# Patient Record
Sex: Female | Born: 1985 | Race: White | Hispanic: Yes | Marital: Single | Smoking: Never smoker
Health system: Southern US, Community
[De-identification: ages and names within clinical notes are randomized; demographics above are authoritative.]

## PROBLEM LIST (undated history)

## (undated) DIAGNOSIS — F32A Depression, unspecified: Secondary | ICD-10-CM

## (undated) DIAGNOSIS — D649 Anemia, unspecified: Secondary | ICD-10-CM

## (undated) DIAGNOSIS — S069X9A Unspecified intracranial injury with loss of consciousness of unspecified duration, initial encounter: Secondary | ICD-10-CM

## (undated) DIAGNOSIS — K219 Gastro-esophageal reflux disease without esophagitis: Secondary | ICD-10-CM

## (undated) DIAGNOSIS — J45909 Unspecified asthma, uncomplicated: Secondary | ICD-10-CM

## (undated) DIAGNOSIS — R519 Headache, unspecified: Secondary | ICD-10-CM

## (undated) DIAGNOSIS — M1990 Unspecified osteoarthritis, unspecified site: Secondary | ICD-10-CM

## (undated) DIAGNOSIS — R51 Headache: Secondary | ICD-10-CM

## (undated) DIAGNOSIS — R413 Other amnesia: Secondary | ICD-10-CM

## (undated) DIAGNOSIS — E039 Hypothyroidism, unspecified: Secondary | ICD-10-CM

## (undated) DIAGNOSIS — H547 Unspecified visual loss: Secondary | ICD-10-CM

## (undated) DIAGNOSIS — R569 Unspecified convulsions: Secondary | ICD-10-CM

## (undated) DIAGNOSIS — F329 Major depressive disorder, single episode, unspecified: Secondary | ICD-10-CM

## (undated) DIAGNOSIS — K529 Noninfective gastroenteritis and colitis, unspecified: Secondary | ICD-10-CM

## (undated) DIAGNOSIS — F419 Anxiety disorder, unspecified: Secondary | ICD-10-CM

## (undated) HISTORY — PX: CHOLECYSTECTOMY: SHX55

## (undated) HISTORY — PX: GASTROSTOMY W/ FEEDING TUBE: SUR642

## (undated) HISTORY — PX: BRAIN SURGERY: SHX531

## (undated) HISTORY — DX: Unspecified visual loss: H54.7

## (undated) HISTORY — DX: Noninfective gastroenteritis and colitis, unspecified: K52.9

## (undated) HISTORY — PX: APPENDECTOMY: SHX54

## (undated) HISTORY — DX: Other amnesia: R41.3

## (undated) HISTORY — PX: THYROID SURGERY: SHX805

## (undated) HISTORY — DX: Gastro-esophageal reflux disease without esophagitis: K21.9

---
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
## 2015-05-08 ENCOUNTER — Emergency Department: Payer: Medicaid Other

## 2015-05-08 ENCOUNTER — Encounter: Payer: Self-pay | Admitting: Emergency Medicine

## 2015-05-08 ENCOUNTER — Emergency Department
Admission: EM | Admit: 2015-05-08 | Discharge: 2015-05-08 | Disposition: A | Discharge status: Home / Self Care | Payer: Medicaid Other | Attending: Emergency Medicine | Admitting: Emergency Medicine

## 2015-05-08 DIAGNOSIS — R519 Headache, unspecified: Secondary | ICD-10-CM

## 2015-05-08 DIAGNOSIS — R51 Headache: Secondary | ICD-10-CM | POA: Diagnosis present

## 2015-05-08 MED ORDER — PROCHLORPERAZINE MALEATE 10 MG PO TABS: 10.0000 mg | ORAL_TABLET | Freq: Three times a day (TID) | ORAL | Status: DC | PRN

## 2015-05-08 MED ORDER — PROCHLORPERAZINE EDISYLATE 5 MG/ML IJ SOLN
INTRAMUSCULAR | Status: AC
Start: 2015-05-08 — End: 2015-05-08
  Administered 2015-05-08: 10 mg via INTRAVENOUS
  Filled 2015-05-08: qty 2

## 2015-05-08 MED ORDER — ONDANSETRON HCL 4 MG/2ML IJ SOLN
INTRAMUSCULAR | Status: AC
  Filled 2015-05-08: qty 2

## 2015-05-08 MED ORDER — PROCHLORPERAZINE EDISYLATE 5 MG/ML IJ SOLN
10.0000 mg | Freq: Four times a day (QID) | INTRAMUSCULAR | Status: DC | PRN
  Administered 2015-05-08: 10 mg via INTRAVENOUS

## 2015-05-08 MED ORDER — ONDANSETRON HCL 4 MG/2ML IJ SOLN
4.0000 mg | Freq: Once | INTRAMUSCULAR | Status: AC
  Administered 2015-05-08: 4 mg via INTRAVENOUS

## 2015-05-08 MED ORDER — SODIUM CHLORIDE 0.9 % IV BOLUS (SEPSIS)
1000.0000 mL | Freq: Once | INTRAVENOUS | Status: AC
  Administered 2015-05-08: 1000 mL via INTRAVENOUS
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
## 2015-05-08 NOTE — ED Provider Notes (Signed)
[facility] Emergency Department Provider Note    ____________________________________________  Time seen: 1500  I have reviewed the triage vital signs and the nursing notes.   HISTORY  Chief Complaint Headache   History limited by: Not Limited   HPI Holly Middleton is a 29 y.o. female presents to the emergency department today accompanied by her mother. The patient presents because of headache. It started this morning. It has been severe. Located mostly in the frontal parts of her head. She states she frequently has headaches this bad. 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 Is like her normal headache. She denies any weakness or numbness in any extremity. Denies any fevers.     History reviewed. No pertinent past medical history.  There are no active problems to display for this patient.   History reviewed. No pertinent past surgical history.  Current Outpatient Rx  Name  Route  Sig  Dispense  Refill  . prochlorperazine (COMPAZINE) 10 MG tablet   Oral   Take 1 tablet (10 mg total) by mouth every 8 (eight) hours as needed (Headache).   20 tablet   0     Allergies Review of patient's allergies indicates not on file.  History reviewed. No pertinent family history.  Social History History  Substance Use Topics  . Smoking status: Never Smoker   . Smokeless tobacco: Not on file  . Alcohol Use: No    Review of Systems  Constitutional: Negative for fever. Cardiovascular: Negative for chest pain. Respiratory: Negative for shortness of breath. Gastrointestinal: Some nausea Genitourinary: Negative for dysuria. Musculoskeletal: Negative for back pain. Skin: Negative for rash. Neurological: Significant for headache  10-point ROS otherwise negative.  ____________________________________________   PHYSICAL EXAM:  VITAL SIGNS: ED Triage Vitals  Enc Vitals Group     BP 05/08/15 1140 96/63 mmHg     Pulse Rate 05/08/15 1215 74     Resp 05/08/15 1140 18     Temp 05/08/15 1140 98.5 F (36.9 C)     Temp Source 05/08/15 1140 Oral     SpO2 05/08/15 1140 97 %     Weight 05/08/15 1140 170 lb (77.111 kg)     Height 05/08/15 1140 5\' 2"  (1.575 m)     Head Cir --      Peak Flow --      Pain Score 05/08/15 1136 6   Constitutional: Alert and oriented. Well appearing and in no distress. Eyes: Conjunctivae are normal. PERRL. Normal extraocular movements. ENT   Head: Normocephalic and atraumatic.   Nose: No congestion/rhinnorhea.   Mouth/Throat: Mucous membranes are moist.   Neck: No stridor. Hematological/Lymphatic/Immunilogical: No cervical lymphadenopathy. Cardiovascular: Normal rate, regular rhythm.  No murmurs, rubs, or gallops. Respiratory: Normal respiratory effort without tachypnea nor retractions. Breath sounds are clear and equal bilaterally. No wheezes/rales/rhonchi. Gastrointestinal: Soft and nontender. No distention.  Genitourinary: Deferred Musculoskeletal: Normal range of motion in all extremities. No joint effusions.  No lower extremity tenderness nor edema. Neurologic:  Normal speech and language. No gross focal neurologic deficits are appreciated. Speech is normal.  Skin:  Skin is warm, dry and intact. No rash noted. Psychiatric: Mood and affect are normal. Speech and behavior are normal. Patient exhibits appropriate insight and judgment.  ____________________________________________    LABS (pertinent positives/negatives)  None  ____________________________________________   EKG  None  ____________________________________________    RADIOLOGY   IMPRESSION: Areas of frontal lobe encephalomalacia, significantly more on  the left than on the right. No acute appearing infarct. No hemorrhage, mass, or extra-axial fluid collection. Postoperative change on the right.  ____________________________________________   PROCEDURES  Procedure(s)  performed: None  Critical Care performed: No  ____________________________________________   INITIAL IMPRESSION / ASSESSMENT AND PLAN / ED COURSE  Pertinent labs & imaging results that were available during my care of the patient were reviewed by me and considered in my medical decision making (see chart for details).  Patient here with headache. Patient with history of TBI.  On exam no focal neuro deficits.  Will get CT head in give IV fluids and medications.  ----------------------------------------- 4:25 PM on 05/08/2015 -----------------------------------------  Patient states she feels improved, no longer having a headache after medications. Head CT without any acute findings. Will discharge home with Compazine.  In my judgment, in view of the above findings, the patient has a reassuring evaluation and can be safely discharged.Issues concerning treatment and diagnosis were discussed. There were no barriers to understanding. The plan of treatment explanation was well received by the patient and/or family who then verbalized understanding.    ____________________________________________   FINAL CLINICAL IMPRESSION(S) / ED DIAGNOSES  Final diagnoses:  Headache, unspecified headache type     Phineas SemenGraydon Anjali Manzella, MD 05/08/15 1627

## 2015-05-08 NOTE — ED Notes (Signed)
Patient transported to CT 

## 2015-05-08 NOTE — Discharge Instructions (Signed)
Please seek medical attention for any high fevers, chest pain, shortness of breath, change in behavior, persistent vomiting, bloody stool or any other new or concerning symptoms. ° °Headaches, Frequently Asked Questions °MIGRAINE HEADACHES °Q: What is migraine? What causes it? How can I treat it? °A: Generally, migraine headaches begin as a dull ache. Then they develop into a constant, throbbing, and pulsating pain. You may experience pain at the temples. You may experience pain at the front or back of one or both sides of the head. The pain is usually accompanied by a combination of: °· Nausea. °· Vomiting. °· Sensitivity to light and noise. °Some people (about 15%) experience an aura (see below) before an attack. The cause of migraine is believed to be chemical reactions in the brain. Treatment for migraine may include over-the-counter or prescription medications. It may also include self-help techniques. These include relaxation training and biofeedback.  °Q: What is an aura? °A: About 15% of people with migraine get an "aura". This is a sign of neurological symptoms that occur before a migraine headache. You may see wavy or jagged lines, dots, or flashing lights. You might experience tunnel vision or blind spots in one or both eyes. The aura can include visual or auditory hallucinations (something imagined). It may include disruptions in smell (such as strange odors), taste or touch. Other symptoms include: °· Numbness. °· A "pins and needles" sensation. °· Difficulty in recalling or speaking the correct word. °These neurological events may last as long as 60 minutes. These symptoms will fade as the headache begins. °Q: What is a trigger? °A: Certain physical or environmental factors can lead to or "trigger" a migraine. These include: °· Foods. °· Hormonal changes. °· Weather. °· Stress. °It is important to remember that triggers are different for everyone. To help prevent migraine attacks, you need to figure  out which triggers affect you. Keep a headache diary. This is a good way to track triggers. The diary will help you talk to your healthcare professional about your condition. °Q: Does weather affect migraines? °A: Bright sunshine, hot, humid conditions, and drastic changes in barometric pressure may lead to, or "trigger," a migraine attack in some people. But studies have shown that weather does not act as a trigger for everyone with migraines. °Q: What is the link between migraine and hormones? °A: Hormones start and regulate many of your body's functions. Hormones keep your body in balance within a constantly changing environment. The levels of hormones in your body are unbalanced at times. Examples are during menstruation, pregnancy, or menopause. That can lead to a migraine attack. In fact, about three quarters of all women with migraine report that their attacks are related to the menstrual cycle.  °Q: Is there an increased risk of stroke for migraine sufferers? °A: The likelihood of a migraine attack causing a stroke is very remote. That is not to say that migraine sufferers cannot have a stroke associated with their migraines. In persons under age 40, the most common associated factor for stroke is migraine headache. But over the course of a person's normal life span, the occurrence of migraine headache may actually be associated with a reduced risk of dying from cerebrovascular disease due to stroke.  °Q: What are acute medications for migraine? °A: Acute medications are used to treat the pain of the headache after it has started. Examples over-the-counter medications, NSAIDs, ergots, and triptans.  °Q: What are the triptans? °A: Triptans are the newest class of abortive medications.   They are specifically targeted to treat migraine. Triptans are vasoconstrictors. They moderate some chemical reactions in the brain. The triptans work on receptors in your brain. Triptans help to restore the balance of a  neurotransmitter called serotonin. Fluctuations in levels of serotonin are thought to be a main cause of migraine.  °Q: Are over-the-counter medications for migraine effective? °A: Over-the-counter, or "OTC," medications may be effective in relieving mild to moderate pain and associated symptoms of migraine. But you should see your caregiver before beginning any treatment regimen for migraine.  °Q: What are preventive medications for migraine? °A: Preventive medications for migraine are sometimes referred to as "prophylactic" treatments. They are used to reduce the frequency, severity, and length of migraine attacks. Examples of preventive medications include antiepileptic medications, antidepressants, beta-blockers, calcium channel blockers, and NSAIDs (nonsteroidal anti-inflammatory drugs). °Q: Why are anticonvulsants used to treat migraine? °A: During the past few years, there has been an increased interest in antiepileptic drugs for the prevention of migraine. They are sometimes referred to as "anticonvulsants". Both epilepsy and migraine may be caused by similar reactions in the brain.  °Q: Why are antidepressants used to treat migraine? °A: Antidepressants are typically used to treat people with depression. They may reduce migraine frequency by regulating chemical levels, such as serotonin, in the brain.  °Q: What alternative therapies are used to treat migraine? °A: The term "alternative therapies" is often used to describe treatments considered outside the scope of conventional Western medicine. Examples of alternative therapy include acupuncture, acupressure, and yoga. Another common alternative treatment is herbal therapy. Some herbs are believed to relieve headache pain. Always discuss alternative therapies with your caregiver before proceeding. Some herbal products contain arsenic and other toxins. °TENSION HEADACHES °Q: What is a tension-type headache? What causes it? How can I treat it? °A:  Tension-type headaches occur randomly. They are often the result of temporary stress, anxiety, fatigue, or anger. Symptoms include soreness in your temples, a tightening band-like sensation around your head (a "vice-like" ache). Symptoms can also include a pulling feeling, pressure sensations, and contracting head and neck muscles. The headache begins in your forehead, temples, or the back of your head and neck. Treatment for tension-type headache may include over-the-counter or prescription medications. Treatment may also include self-help techniques such as relaxation training and biofeedback. °CLUSTER HEADACHES °Q: What is a cluster headache? What causes it? How can I treat it? °A: Cluster headache gets its name because the attacks come in groups. The pain arrives with little, if any, warning. It is usually on one side of the head. A tearing or bloodshot eye and a runny nose on the same side of the headache may also accompany the pain. Cluster headaches are believed to be caused by chemical reactions in the brain. They have been described as the most severe and intense of any headache type. Treatment for cluster headache includes prescription medication and oxygen. °SINUS HEADACHES °Q: What is a sinus headache? What causes it? How can I treat it? °A: When a cavity in the bones of the face and skull (a sinus) becomes inflamed, the inflammation will cause localized pain. This condition is usually the result of an allergic reaction, a tumor, or an infection. If your headache is caused by a sinus blockage, such as an infection, you will probably have a fever. An x-ray will confirm a sinus blockage. Your caregiver's treatment might include antibiotics for the infection, as well as antihistamines or decongestants.  °REBOUND HEADACHES °Q: What is a rebound   headache? What causes it? How can I treat it? °A: A pattern of taking acute headache medications too often can lead to a condition known as "rebound headache." A  pattern of taking too much headache medication includes taking it more than 2 days per week or in excessive amounts. That means more than the label or a caregiver advises. With rebound headaches, your medications not only stop relieving pain, they actually begin to cause headaches. Doctors treat rebound headache by tapering the medication that is being overused. Sometimes your caregiver will gradually substitute a different type of treatment or medication. Stopping may be a challenge. Regularly overusing a medication increases the potential for serious side effects. Consult a caregiver if you regularly use headache medications more than 2 days per week or more than the label advises. °ADDITIONAL QUESTIONS AND ANSWERS °Q: What is biofeedback? °A: Biofeedback is a self-help treatment. Biofeedback uses special equipment to monitor your body's involuntary physical responses. Biofeedback monitors: °· Breathing. °· Pulse. °· Heart rate. °· Temperature. °· Muscle tension. °· Brain activity. °Biofeedback helps you refine and perfect your relaxation exercises. You learn to control the physical responses that are related to stress. Once the technique has been mastered, you do not need the equipment any more. °Q: Are headaches hereditary? °A: Four out of five (80%) of people that suffer report a family history of migraine. Scientists are not sure if this is genetic or a family predisposition. Despite the uncertainty, a child has a 50% chance of having migraine if one parent suffers. The child has a 75% chance if both parents suffer.  °Q: Can children get headaches? °A: By the time they reach high school, most young people have experienced some type of headache. Many safe and effective approaches or medications can prevent a headache from occurring or stop it after it has begun.  °Q: What type of doctor should I see to diagnose and treat my headache? °A: Start with your primary caregiver. Discuss his or her experience and  approach to headaches. Discuss methods of classification, diagnosis, and treatment. Your caregiver may decide to recommend you to a headache specialist, depending upon your symptoms or other physical conditions. Having diabetes, allergies, etc., may require a more comprehensive and inclusive approach to your headache. The National Headache Foundation will provide, upon request, a list of NHF physician members in your state. °Document Released: 03/06/2004 Document Revised: 03/08/2012 Document Reviewed: 08/14/2008 °ExitCare® Patient Information ©2015 [employer]. This information is not intended to replace advice given to you by your health care provider. Make sure you discuss any questions you have with your health care provider. ° °

## 2016-03-02 ENCOUNTER — Emergency Department: Payer: Medicaid Other

## 2016-03-02 ENCOUNTER — Emergency Department
Admission: EM | Admit: 2016-03-02 | Discharge: 2016-03-02 | Disposition: A | Discharge status: Home / Self Care | Payer: Medicaid Other | Attending: Emergency Medicine | Admitting: Emergency Medicine

## 2016-03-02 ENCOUNTER — Encounter: Payer: Self-pay | Admitting: Emergency Medicine

## 2016-03-02 DIAGNOSIS — F1210 Cannabis abuse, uncomplicated: Secondary | ICD-10-CM | POA: Insufficient documentation

## 2016-03-02 DIAGNOSIS — F1310 Sedative, hypnotic or anxiolytic abuse, uncomplicated: Secondary | ICD-10-CM | POA: Diagnosis not present

## 2016-03-02 DIAGNOSIS — Z87820 Personal history of traumatic brain injury: Secondary | ICD-10-CM | POA: Diagnosis not present

## 2016-03-02 DIAGNOSIS — R110 Nausea: Secondary | ICD-10-CM | POA: Insufficient documentation

## 2016-03-02 DIAGNOSIS — F419 Anxiety disorder, unspecified: Secondary | ICD-10-CM | POA: Insufficient documentation

## 2016-03-02 DIAGNOSIS — F17200 Nicotine dependence, unspecified, uncomplicated: Secondary | ICD-10-CM | POA: Insufficient documentation

## 2016-03-02 DIAGNOSIS — R4020 Unspecified coma: Secondary | ICD-10-CM | POA: Insufficient documentation

## 2016-03-02 DIAGNOSIS — R569 Unspecified convulsions: Secondary | ICD-10-CM

## 2016-03-02 DIAGNOSIS — M542 Cervicalgia: Secondary | ICD-10-CM | POA: Diagnosis not present

## 2016-03-02 HISTORY — DX: Unspecified convulsions: R56.9

## 2016-03-02 HISTORY — DX: Unspecified intracranial injury with loss of consciousness of unspecified duration, initial encounter: S06.9X9A

## 2016-03-02 LAB — URINALYSIS COMPLETE WITH MICROSCOPIC (ARMC ONLY)
Bilirubin Urine: NEGATIVE
GLUCOSE, UA: NEGATIVE mg/dL
HGB URINE DIPSTICK: NEGATIVE
KETONES UR: NEGATIVE mg/dL
Nitrite: NEGATIVE
PH: 5 (ref 5.0–8.0)
Protein, ur: NEGATIVE mg/dL
Specific Gravity, Urine: 1.016 (ref 1.005–1.030)

## 2016-03-02 LAB — COMPREHENSIVE METABOLIC PANEL
ALBUMIN: 4.4 g/dL (ref 3.5–5.0)
ALK PHOS: 77 U/L (ref 38–126)
ALT: 23 U/L (ref 14–54)
ANION GAP: 15 (ref 5–15)
AST: 39 U/L (ref 15–41)
BILIRUBIN TOTAL: 0.6 mg/dL (ref 0.3–1.2)
BUN: 11 mg/dL (ref 6–20)
CO2: 19 mmol/L — ABNORMAL LOW (ref 22–32)
Calcium: 8.7 mg/dL — ABNORMAL LOW (ref 8.9–10.3)
Chloride: 100 mmol/L — ABNORMAL LOW (ref 101–111)
Creatinine, Ser: 1.02 mg/dL — ABNORMAL HIGH (ref 0.44–1.00)
GFR calc Af Amer: >60 mL/min (ref >60)
GFR calc non Af Amer: >60 mL/min (ref >60)
Glucose, Bld: 141 mg/dL — ABNORMAL HIGH (ref 65–99)
POTASSIUM: 3.1 mmol/L — AB (ref 3.5–5.1)
SODIUM: 134 mmol/L — AB (ref 135–145)
Total Protein: 7.8 g/dL (ref 6.5–8.1)

## 2016-03-02 LAB — URINE DRUG SCREEN, QUALITATIVE (ARMC ONLY)
AMPHETAMINES, UR SCREEN: NOT DETECTED
BARBITURATES, UR SCREEN: NOT DETECTED
BENZODIAZEPINE, UR SCRN: NOT DETECTED
Cannabinoid 50 Ng, Ur ~~LOC~~: POSITIVE — AB
Cocaine Metabolite,Ur ~~LOC~~: NOT DETECTED
MDMA (Ecstasy)Ur Screen: NOT DETECTED
METHADONE SCREEN, URINE: NOT DETECTED
OPIATE, UR SCREEN: NOT DETECTED
PHENCYCLIDINE (PCP) UR S: NOT DETECTED
Tricyclic, Ur Screen: POSITIVE — AB

## 2016-03-02 LAB — CBC
HCT: 35.7 % (ref 35.0–47.0)
HEMOGLOBIN: 12.4 g/dL (ref 12.0–16.0)
MCH: 33.5 pg (ref 26.0–34.0)
MCHC: 34.9 g/dL (ref 32.0–36.0)
MCV: 96.2 fL (ref 80.0–100.0)
Platelets: 180 10*3/uL (ref 150–440)
RBC: 3.71 MIL/uL — ABNORMAL LOW (ref 3.80–5.20)
RDW: 13.5 % (ref 11.5–14.5)
WBC: 5.2 10*3/uL (ref 3.6–11.0)

## 2016-03-02 MED ORDER — LEVETIRACETAM 750 MG PO TABS: 750.0000 mg | ORAL_TABLET | Freq: Two times a day (BID) | ORAL | Status: AC

## 2016-03-02 MED ORDER — SODIUM CHLORIDE 0.9 % IV SOLN
750.0000 mg | Freq: Once | INTRAVENOUS | Status: AC
  Administered 2016-03-02: 750 mg via INTRAVENOUS
  Filled 2016-03-02: qty 7.5

## 2016-03-02 NOTE — ED Notes (Signed)
[facility], family reported to EMS that patient was holding an infant and passed out dropping the baby, patient then began to posturing and shaking for approx. 30 seconds. On arrival to ED patient alert and anxious. Patient only complaint at this time is nausea.

## 2016-03-02 NOTE — ED Provider Notes (Signed)
[facility] Emergency Department Provider Note  ____________________________________________  Time seen: On arrival, via EMS  I have reviewed the triage vital signs and the nursing notes.   HISTORY  Chief Complaint Seizures    HPI Holly Middleton is a 30 y.o. female who presents with possible seizure. Reportedly the patient became unresponsive with twitching at the mouth and rigidity of both arms. Reportedly this lasted approximately 30 seconds. Patient feels well now although she is anxious. She denies recent head injury. She does have a history of traumatic brain injury in the past which was apparently affected her short-term memory per her mother. Patient denies neuro deficits. She denies fevers or chills. No recent illnesses. No change in vision    Past Medical History  Diagnosis Date  . TBI (traumatic brain injury) (HCC)     There are no active problems to display for this patient.   Past Surgical History  Procedure Laterality Date  . Brain surgery      Current Outpatient Rx  Name  Route  Sig  Dispense  Refill  . prochlorperazine (COMPAZINE) 10 MG tablet   Oral   Take 1 tablet (10 mg total) by mouth every 8 (eight) hours as needed (Headache).   20 tablet   0     Allergies Review of patient's allergies indicates no known allergies.  No family history on file.  Social History Social History  Substance Use Topics  . Smoking status: Current Some Day Smoker  . Smokeless tobacco: None  . Alcohol Use: Yes    Review of Systems  Constitutional: No fever. No recent illness  Eyes: Negative for visual changes. ENT: No neck pain Cardiovascular: Negative for chest pain. Respiratory: Negative for shortness of breath. Gastrointestinal: Positive for nausea. Genitourinary: Negative for dysuria. Musculoskeletal: Negative for back pain. For neck pain Skin: Negative for rash. Neurological: Negative for headaches or focal weakness Psychiatric:  Anxious    ____________________________________________   PHYSICAL EXAM:  VITAL SIGNS: ED Triage Vitals  Enc Vitals Group     BP 03/02/16 1509 104/90 mmHg     Pulse Rate 03/02/16 1509 83     Resp 03/02/16 1509 18     Temp --      Temp src --      SpO2 03/02/16 1509 96 %     Weight 03/02/16 1509 168 lb (76.204 kg)     Height 03/02/16 1509 5\' 2"  (1.575 m)     Head Cir --      Peak Flow --      Pain Score 03/02/16 1510 0     Pain Loc --      Pain Edu? --      Excl. in GC? --      Constitutional: Alert and oriented. Well appearing and in no distress. Tearful Eyes: Conjunctivae are normal. PERRLA, EOMI ENT   Head: Normocephalic and atraumatic.   Mouth/Throat: Mucous membranes are moist. Cardiovascular: Normal rate, regular rhythm. Normal and symmetric distal pulses are present in all extremities. Respiratory: Normal respiratory effort without tachypnea nor retractions. Breath sounds are clear and equal bilaterally.  Gastrointestinal: Soft and non-tender in all quadrants. No distention.  Genitourinary: deferred Musculoskeletal: Nontender with normal range of motion in all extremities.  Neurologic:  Normal speech and language. No gross focal neurologic deficits are appreciated. Skin:  Skin is warm, dry and intact. No rash noted. Psychiatric: Mood and affect are normal. Patient exhibits appropriate insight and judgment.  ____________________________________________  LABS (pertinent positives/negatives)  Labs Reviewed  CBC  COMPREHENSIVE METABOLIC PANEL  URINALYSIS COMPLETEWITH MICROSCOPIC ([facility] ONLY)  URINE DRUG SCREEN, QUALITATIVE ([facility] ONLY)    ____________________________________________   EKG  ED ECG REPORT I, Jene Every, the attending physician, personally viewed and interpreted this ECG.  Date: 03/02/2016 EKG Time: 3:05 PM Rate: 79 Rhythm: normal sinus rhythm QRS Axis: normal Intervals: Prolonged QT ST/T Wave abnormalities:  normal Conduction Disturbances: none Narrative Interpretation: Significant prolonged QT   ____________________________________________    RADIOLOGY I have personally reviewed any xrays that were ordered on this patient: CT head shows encephalomalacia  ____________________________________________   PROCEDURES  Procedure(s) performed: none  Critical Care performed: none  ____________________________________________   INITIAL IMPRESSION / ASSESSMENT AND PLAN / ED COURSE  Pertinent labs & imaging results that were available during my care of the patient were reviewed by me and considered in my medical decision making (see chart for details).  Patient presents with possible seizure. Reviewing her records there is an old CT that shows frontal encephalomalacia so she is at higher risk for seizure.  CT head shows encephalomalacia, discuss this with Dr. Loretha Brasil of neurology who studied the CT. He recommends Keppra 750 twice a day.  I did give the patient 750 mg IV in the emergency department while we observed her to make sure no further seizure activity. Recommended outpatient follow-up with neurology. Patient and mother agree with this plan    ____________________________________________   FINAL CLINICAL IMPRESSION(S) / ED DIAGNOSES  Final diagnoses:  Seizure (HCC)     Jene Every, MD 03/02/16 2116

## 2016-03-02 NOTE — Discharge Instructions (Signed)

## 2016-03-02 NOTE — ED Notes (Signed)
Patient mother at bedside, reports that over the past week patient has an increase in short term memory loss. Today while sitting on the couch holding an infant patient began to shake and fall over to the side with hands and mouth twitching, patient mother also reports foaming at the mouth and that patients lips turned purple, states that episode lasted for about 15-30 seconds.

## 2016-03-28 ENCOUNTER — Encounter: Payer: Self-pay | Admitting: Emergency Medicine

## 2016-03-28 ENCOUNTER — Emergency Department
Admission: EM | Admit: 2016-03-28 | Discharge: 2016-03-28 | Disposition: A | Discharge status: Home / Self Care | Payer: Medicaid Other | Attending: Emergency Medicine | Admitting: Emergency Medicine

## 2016-03-28 DIAGNOSIS — T426X5A Adverse effect of other antiepileptic and sedative-hypnotic drugs, initial encounter: Secondary | ICD-10-CM | POA: Diagnosis not present

## 2016-03-28 DIAGNOSIS — F17200 Nicotine dependence, unspecified, uncomplicated: Secondary | ICD-10-CM | POA: Insufficient documentation

## 2016-03-28 DIAGNOSIS — R21 Rash and other nonspecific skin eruption: Secondary | ICD-10-CM | POA: Diagnosis present

## 2016-03-28 DIAGNOSIS — L270 Generalized skin eruption due to drugs and medicaments taken internally: Secondary | ICD-10-CM | POA: Insufficient documentation

## 2016-03-28 DIAGNOSIS — Z79899 Other long term (current) drug therapy: Secondary | ICD-10-CM | POA: Diagnosis not present

## 2016-03-28 DIAGNOSIS — T50905A Adverse effect of unspecified drugs, medicaments and biological substances, initial encounter: Secondary | ICD-10-CM

## 2016-03-28 LAB — CBC WITH DIFFERENTIAL/PLATELET
BASOS ABS: 0 10*3/uL (ref 0–0.1)
BASOS PCT: 1 %
EOS PCT: 2 %
Eosinophils Absolute: 0.1 10*3/uL (ref 0–0.7)
HCT: 37.4 % (ref 35.0–47.0)
Hemoglobin: 13.1 g/dL (ref 12.0–16.0)
Lymphocytes Relative: 22 %
Lymphs Abs: 0.8 10*3/uL — ABNORMAL LOW (ref 1.0–3.6)
MCH: 33.1 pg (ref 26.0–34.0)
MCHC: 35 g/dL (ref 32.0–36.0)
MCV: 94.7 fL (ref 80.0–100.0)
MONO ABS: 0.2 10*3/uL (ref 0.2–0.9)
Monocytes Relative: 6 %
Neutro Abs: 2.5 10*3/uL (ref 1.4–6.5)
Neutrophils Relative %: 69 %
Platelets: 143 10*3/uL — ABNORMAL LOW (ref 150–440)
RBC: 3.95 MIL/uL (ref 3.80–5.20)
RDW: 13.3 % (ref 11.5–14.5)
WBC: 3.7 10*3/uL (ref 3.6–11.0)

## 2016-03-28 LAB — BASIC METABOLIC PANEL
Anion gap: 6 (ref 5–15)
BUN: 14 mg/dL (ref 6–20)
CALCIUM: 8.9 mg/dL (ref 8.9–10.3)
CO2: 29 mmol/L (ref 22–32)
CREATININE: 0.7 mg/dL (ref 0.44–1.00)
Chloride: 98 mmol/L — ABNORMAL LOW (ref 101–111)
GFR calc Af Amer: >60 mL/min (ref >60)
GFR calc non Af Amer: >60 mL/min (ref >60)
GLUCOSE: 106 mg/dL — AB (ref 65–99)
Potassium: 3.3 mmol/L — ABNORMAL LOW (ref 3.5–5.1)
Sodium: 133 mmol/L — ABNORMAL LOW (ref 135–145)

## 2016-03-28 MED ORDER — DIPHENHYDRAMINE HCL 25 MG PO CAPS
25.0000 mg | ORAL_CAPSULE | Freq: Once | ORAL | Status: AC
  Administered 2016-03-28: 25 mg via ORAL
  Filled 2016-03-28: qty 1

## 2016-03-28 MED ORDER — FAMOTIDINE 20 MG PO TABS
40.0000 mg | ORAL_TABLET | Freq: Once | ORAL | Status: AC
  Administered 2016-03-28: 40 mg via ORAL
  Filled 2016-03-28: qty 2

## 2016-03-28 NOTE — Discharge Instructions (Signed)
Drug Allergy °Allergic reactions to medicines are common. Some allergic reactions are mild. A delayed type of drug allergy that occurs 1 week or more after exposure to a medicine or vaccine is called serum sickness. A life-threatening, sudden (acute) allergic reaction that involves the whole body is called anaphylaxis. °CAUSES  °"True" drug allergies occur when there is an allergic reaction to a medicine. This is caused by overactivity of the immune system. First, the body becomes sensitized. The immune system is triggered by your first exposure to the medicine. Following this first exposure, future exposure to the same medicine may be life-threatening. °Almost any medicine can cause an allergic reaction. Common ones are: °· Penicillin. °· Sulfonamides (sulfa drugs). °· Local anesthetics. °· X-ray dyes that contain iodine. °SYMPTOMS  °Common symptoms of a minor allergic reaction are: °· Swelling around the mouth. °· An itchy red rash or hives. °· Vomiting or diarrhea. °Anaphylaxis can cause swelling of the mouth and throat. This makes it difficult to breathe and swallow. Severe reactions can be fatal within seconds, even after exposure to only a trace amount of the drug that causes the reaction. °HOME CARE INSTRUCTIONS °· If you are unsure of what caused your reaction, write down: °¨ The names of the medicines you took. °¨ How much medicine you took. °¨ How you took the medicine, such as whether you took a pill, injected the medicine, or applied it to your skin. °¨ All of the things you ate and drank. °¨ The date and time of your reaction. °¨ The symptoms of the reaction. °· You may want to follow up with an allergy specialist after the reaction has cleared in order to be tested to confirm the allergy. It is important to confirm that your reaction is an allergy, not just a side effect to the medicine. If you have a true allergy to a medicine, this may prevent that medicine and related medicines from being given to  you when you are very ill. °· If you have hives or a rash: °¨ Take medicines as directed by your caregiver. °¨ You may use an over-the-counter antihistamine (diphenhydramine) as needed. °¨ Apply cold compresses to the skin or take baths in cool water. Avoid hot baths or showers. °· If you are severely allergic: °¨ Continuous observation after a severe reaction may be needed. Hospitalization is often required. °¨ Wear a medical alert bracelet or necklace stating your allergy. °¨ You and your family must learn how to use an anaphylaxis kit or give an epinephrine injection to temporarily treat an emergency allergic reaction. If you have had a severe reaction, always carry your epinephrine injection or anaphylaxis kit with you. This can be lifesaving if you have a severe reaction. °· Do not drive or perform tasks after treatment until the medicines used to treat your reaction have worn off, or until your caregiver says it is okay. °· If you have a drug allergy that was confirmed by your health care provider: °¨ Carry information about the drug allergy with you at all times. °¨ Always check with a pharmacist before taking any over-the-counter medicine. °SEEK MEDICAL CARE IF:  °· You think you had an allergic reaction. Symptoms usually start within 30 minutes after exposure. °· Symptoms are getting worse rather than better. °· You develop new symptoms. °· The symptoms that brought you to your caregiver return. °SEEK IMMEDIATE MEDICAL CARE IF:  °· You have swelling of the mouth, difficulty breathing, or wheezing. °· You have a tight   feeling in your chest or throat.  You develop hives, swelling, or itching all over your body.  You develop severe vomiting or diarrhea.  You feel faint or pass out. This is an emergency. Use your epinephrine injection or anaphylaxis kit as you have been instructed. Call for emergency medical help. Even if you improve after the injection, you need to be examined at a hospital emergency  department. MAKE SURE YOU:   Understand these instructions.  Will watch your condition.  Will get help right away if you are not doing well or get worse.   This information is not intended to replace advice given to you by your health care provider. Make sure you discuss any questions you have with your health care provider.   Document Released: 12/15/2005 Document Revised: 01/05/2015 Document Reviewed: 07/17/2015 Elsevier Interactive Patient Education 2016 [place] appear to be experiencing a common drug reaction to your seizure medicine, Lamictal (lamotrigine). You should discontinue the medicine and follow-up with your provider as discussed. Continue to dose the Keppra as prescribed. Consider dosing OTC Benadryl and Zantac for histamine blockade and itch relief.

## 2016-03-28 NOTE — ED Notes (Signed)
C/o fever and bodyaches and dizziness.  States these sx started on 4/18 after switching to a new seizure medication. NAD

## 2016-03-28 NOTE — ED Provider Notes (Signed)
[facility] Emergency Department Provider Note ____________________________________________  Time seen: 1204  I have reviewed the triage vital signs and the nursing notes.  HISTORY  Chief Complaint  Fever  HPI Holly Middleton is a 30 y.o. female presents to the ED for evaluation of symptoms that include increased headache, dizziness, rash, and facial swelling. She describes she is also had some nausea and vomiting. The symptoms seem to have begun about 2 days following starting her Lamictal at 25 mg twice a day on 3/18. She was evaluated here on 3/41for witnessed seizure activities. She was evaluated via CT and labs and started on Keppra at discharge. She's been tolerating the Keppra well with only mild, tolerable headaches until she was followed up by her primary neurologist, Dr. Malvin Johns. Since that time she's noted an itchy rash globally from the head to the trunk and extremities. She has also noted some increased dizziness with changing positions. She is also noticed some intermittent fevers with a Tmax  of 101.48F 4 days prior. When she contacted her neurologist via telephone, she was advised to discontinue the Lamictal and report to the ED for further evaluation. She last dosed her Lamictal yesterday morning. She denies any interim seizure activity.   Past Medical History  Diagnosis Date  . TBI (traumatic brain injury) (HCC)     There are no active problems to display for this patient.   Past Surgical History  Procedure Laterality Date  . Brain surgery      Current Outpatient Rx  Name  Route  Sig  Dispense  Refill  . levETIRAcetam (KEPPRA) 750 MG tablet   Oral   Take 1 tablet (750 mg total) by mouth 2 (two) times daily.   60 tablet   0   . prochlorperazine (COMPAZINE) 10 MG tablet   Oral   Take 1 tablet (10 mg total) by mouth every 8 (eight) hours as needed (Headache).   20 tablet   0    Allergies Review of patient's allergies indicates no known  allergies.  No family history on file.  Social History Social History  Substance Use Topics  . Smoking status: Current Some Day Smoker  . Smokeless tobacco: None  . Alcohol Use: Yes   Review of Systems  Constitutional: Negative for fever. Eyes: Positive for visual changes. ENT: Negative for sore throat. Cardiovascular: Negative for chest pain. Respiratory: Negative for shortness of breath. Gastrointestinal: Negative for abdominal pain and diarrhea. Positive for nausea & vomiting Genitourinary: Negative for dysuria. Musculoskeletal: Negative for back pain. Skin: Positive for rash. Neurological: Negative for headaches, focal weakness or numbness. ____________________________________________  PHYSICAL EXAM:  VITAL SIGNS: ED Triage Vitals  Enc Vitals Group     BP 03/28/16 1123 101/68 mmHg     Pulse Rate 03/28/16 1123 95     Resp 03/28/16 1123 18     Temp 03/28/16 1123 98.6 F (37 C)     Temp Source 03/28/16 1123 Oral     SpO2 03/28/16 1123 97 %     Weight 03/28/16 1123 150 lb (68.04 kg)     Height 03/28/16 1123  (1.651 m)     Head Cir --      Peak Flow --      Pain Score --      Pain Loc --      Pain Edu? --      Excl. in GC? --    Constitutional: Alert and oriented. Well appearing and in no distress.  Head: Normocephalic and atraumatic.      Eyes: Conjunctivae are normal. PERRL. Normal extraocular movements      Ears: Canals clear. TMs intact bilaterally.   Nose: No congestion/rhinorrhea.   Mouth/Throat: Mucous membranes are moist.   Neck: Supple. No thyromegaly. Hematological/Lymphatic/Immunological: No cervical lymphadenopathy. Cardiovascular: Normal rate, regular rhythm.  Respiratory: Normal respiratory effort. No wheezes/rales/rhonchi. Gastrointestinal: Soft and nontender. No distention. Musculoskeletal: Nontender with normal range of motion in all extremities.  Neurologic: Cranial nerves II through XII grossly intact. Normal UE and LE DTRs  bilaterally. Normal gait without ataxia. Normal speech and language. No gross focal neurologic deficits are appreciated. Skin:  Skin is warm, dry and intact. Scattered, erythematous maculopapular rash to the scalp, neck, torso and extremities. Psychiatric: Mood and affect are normal. Patient exhibits appropriate insight and judgment. ____________________________________________   LABS (pertinent positives/negatives) Labs Reviewed  BASIC METABOLIC PANEL - Abnormal; Notable for the following:    Sodium 133 (*)    Potassium 3.3 (*)    Chloride 98 (*)    Glucose, Bld 106 (*)    All other components within normal limits  CBC WITH DIFFERENTIAL/PLATELET - Abnormal; Notable for the following:    Platelets 143 (*)    Lymphs Abs 0.8 (*)    All other components within normal limits  ____________________________________________  PROCEDURES  Pepcid 40 mg PO Benadryl 25 mg PO ____________________________________________  INITIAL IMPRESSION / ASSESSMENT AND PLAN / ED COURSE  Patient was appears to be in acute drug reaction to Lamictal including rash, dizziness, and increased headache. Her exam otherwise is reassuring without any interim seizure activity or neuromuscular deficit. She is advised to discontinue Lamictal immediately and continue with the Keppra as previously prescribed. She will follow-up with Dr. Malvin JohnsPotter next week for further evaluation. Patient is reassured by her normal lab values today. ____________________________________________  FINAL CLINICAL IMPRESSION(S) / ED DIAGNOSES  Final diagnoses:  Adverse drug reaction, initial encounter      Lissa HoardJenise V Bacon Chelsea Nusz, PA-C 03/28/16 1858  Emily FilbertJonathan E Williams, MD 04/01/16 [phone]

## 2016-03-28 NOTE — ED Notes (Addendum)
Patient states on 3/5 she was seen for seizure which she has never had before.  She was started on Keppra and followed up with Dr. Malvin JohnsPotter.  He changed her Keppra to Lamictal 25mg  BID on 3/18. Patient states on 3/20 she started having memory issues, facial swelling, red rash on arms, trunk and back.  She has vomited several times. Vomited 4x today.  Patient did leave message with office who told her to go to the ED.  She also reports dizziness and fever.  Her last fever was approximately 4 days ago and was 101.9.  Patient states she stopped taking the Lamictal yesterday and was due to increase her dosage tomorrow.  Pt is NAD at this time.  Patient tried to bend over to pick up an item she dropped on the floor and reported dizziness.

## 2016-04-24 LAB — HM PAP SMEAR: HM PAP: NEGATIVE

## 2016-05-30 ENCOUNTER — Encounter
Admission: RE | Admit: 2016-05-30 | Discharge: 2016-05-30 | Disposition: A | Discharge status: Home / Self Care | Payer: Medicaid Other | Source: Ambulatory Visit | Attending: Obstetrics and Gynecology | Admitting: Obstetrics and Gynecology

## 2016-05-30 DIAGNOSIS — Z01810 Encounter for preprocedural cardiovascular examination: Secondary | ICD-10-CM | POA: Insufficient documentation

## 2016-05-30 DIAGNOSIS — Z01812 Encounter for preprocedural laboratory examination: Secondary | ICD-10-CM | POA: Insufficient documentation

## 2016-05-30 HISTORY — DX: Unspecified osteoarthritis, unspecified site: M19.90

## 2016-05-30 HISTORY — DX: Major depressive disorder, single episode, unspecified: F32.9

## 2016-05-30 HISTORY — DX: Depression, unspecified: F32.A

## 2016-05-30 HISTORY — DX: Unspecified asthma, uncomplicated: J45.909

## 2016-05-30 HISTORY — DX: Anxiety disorder, unspecified: F41.9

## 2016-05-30 HISTORY — DX: Anemia, unspecified: D64.9

## 2016-05-30 HISTORY — DX: Headache: R51

## 2016-05-30 HISTORY — DX: Unspecified convulsions: R56.9

## 2016-05-30 HISTORY — DX: Hypothyroidism, unspecified: E03.9

## 2016-05-30 HISTORY — DX: Headache, unspecified: R51.9

## 2016-05-30 HISTORY — DX: Gastro-esophageal reflux disease without esophagitis: K21.9

## 2016-05-30 LAB — CBC
HCT: 40.7 % (ref 35.0–47.0)
Hemoglobin: 14 g/dL (ref 12.0–16.0)
MCH: 31.5 pg (ref 26.0–34.0)
MCHC: 34.5 g/dL (ref 32.0–36.0)
MCV: 91.3 fL (ref 80.0–100.0)
PLATELETS: 242 10*3/uL (ref 150–440)
RBC: 4.46 MIL/uL (ref 3.80–5.20)
RDW: 12.4 % (ref 11.5–14.5)
WBC: 5.6 10*3/uL (ref 3.6–11.0)

## 2016-05-30 LAB — BASIC METABOLIC PANEL
ANION GAP: 10 (ref 5–15)
BUN: 9 mg/dL (ref 6–20)
CO2: 27 mmol/L (ref 22–32)
Calcium: 9.6 mg/dL (ref 8.9–10.3)
Chloride: 102 mmol/L (ref 101–111)
Creatinine, Ser: 0.71 mg/dL (ref 0.44–1.00)
GFR calc Af Amer: >60 mL/min (ref >60)
GFR calc non Af Amer: >60 mL/min (ref >60)
Glucose, Bld: 101 mg/dL — ABNORMAL HIGH (ref 65–99)
POTASSIUM: 3 mmol/L — AB (ref 3.5–5.1)
SODIUM: 139 mmol/L (ref 135–145)

## 2016-05-30 NOTE — Pre-Procedure Instructions (Signed)
Faxed the neurology clearance fax to Dr's Malvin JohnsPotter and Bonney AidStaebler.Received Status OK for Dr. Bonney AidStaebler, left message on Nancy's voicemail to be expecting the neurology clearance. Neurology clearance Fax failed to Dr. Daisy BlossomPotter's office, called office they are closed.  Called [facility] operator, she confirmed the fax number as [phone].  She instructed this RN to fax the clearance to [facility] at [phone] and she would make sure to clearance would make it to Dr. Daisy BlossomPotter's office.
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
## 2016-05-30 NOTE — Patient Instructions (Signed)
  Your procedure is scheduled WG:NFAOZHon:Friday June 9 , 2017. Report to Same Day Surgery. To find out your arrival time please call [phone] between 1PM - 3PM on Thursday June 05, 2016 .  Remember: Instructions that are not followed completely may result in serious medical risk, up to and including death, or upon the discretion of your surgeon and anesthesiologist your surgery may need to be rescheduled.    _x___ 1. Do not eat food or drink liquids after midnight. No gum chewing or hard candies.     ____ 2. No Alcohol for 24 hours before or after surgery.   ____ 3. Bring all medications with you on the day of surgery if instructed.    __x__ 4. Notify your doctor if there is any change in your medical condition     (cold, fever, infections).     Do not wear jewelry, make-up, hairpins, clips or nail polish.  Do not wear lotions, powders, or perfumes. You may wear deodorant.  Do not shave 48 hours prior to surgery. Men may shave face and neck.  Do not bring valuables to the hospital.    [facility] any belongings or valuables.               Contacts, dentures or bridgework may not be worn into surgery.  Leave your suitcase in the car. After surgery it may be brought to your room.  For patients admitted to the hospital, discharge time is determined by your treatment team.   Patients discharged the day of surgery will not be allowed to drive home.    Please read over the following fact sheets that you were given:   [facility]  _x__ Take these medicines the morning of surgery with A SIP OF WATER:    1. levETIRAcetam (KEPPRA)   2. levothyroxine (SYNTHROID, LEVOTHROID)   ____ Fleet Enema (as directed)   _x_ Use CHG Soap as directed on instruction sheet  _x___ Use inhalers on the day of surgery and bring to hospital day of surgery  ____ Stop metformin 2 days prior to surgery    ____ Take 1/2 of usual insulin dose the night before surgery  and none on the morning of surgery.   ____ Stop Coumadin/Plavix/aspirin on does not apply.  _x___ Stop Anti-inflammatories such as Advil, Aleve, Ibuprofen, Motrin, Naproxen, Naprosyn, Goodies powders or aspirin products. Tylenol Ok to take.   ____ Stop supplements until after surgery.    ____ Bring C-Pap to the hospital.

## 2016-05-31 NOTE — Pre-Procedure Instructions (Signed)
Met B results sent to Dr. Georgianne Fick and Anesthesia for review.

## 2016-06-02 ENCOUNTER — Encounter: Payer: Self-pay | Admitting: *Deleted

## 2016-06-02 NOTE — Pre-Procedure Instructions (Signed)
EKG COMPARED TO ED EKG 3/17 AND CALLED TO DR Noralyn PickARROLL. OK TO PROCEED IF NEURO CLEARANCE RECEIVED

## 2016-06-04 NOTE — Pre-Procedure Instructions (Signed)
CLEARED BY DR Malvin JohnsPOTTER 06/04/16 LOW TO MOD RISK  WITH BENEFITS OUTWEIGHING PROBABILTY ADJUSTED RISKS

## 2016-06-06 ENCOUNTER — Ambulatory Visit: Payer: Medicaid Other | Admitting: Anesthesiology

## 2016-06-06 ENCOUNTER — Encounter: Payer: Self-pay | Admitting: *Deleted

## 2016-06-06 ENCOUNTER — Ambulatory Visit
Admission: RE | Admit: 2016-06-06 | Discharge: 2016-06-06 | Disposition: A | Discharge status: Home / Self Care | Payer: Medicaid Other | Source: Ambulatory Visit | Attending: Obstetrics and Gynecology | Admitting: Obstetrics and Gynecology

## 2016-06-06 ENCOUNTER — Encounter
Admission: RE | Disposition: A | Discharge status: Home / Self Care | Payer: Self-pay | Source: Ambulatory Visit | Attending: Obstetrics and Gynecology

## 2016-06-06 DIAGNOSIS — Z302 Encounter for sterilization: Secondary | ICD-10-CM | POA: Diagnosis present

## 2016-06-06 DIAGNOSIS — E039 Hypothyroidism, unspecified: Secondary | ICD-10-CM | POA: Diagnosis not present

## 2016-06-06 DIAGNOSIS — Z862 Personal history of diseases of the blood and blood-forming organs and certain disorders involving the immune mechanism: Secondary | ICD-10-CM | POA: Diagnosis not present

## 2016-06-06 DIAGNOSIS — Z87891 Personal history of nicotine dependence: Secondary | ICD-10-CM | POA: Insufficient documentation

## 2016-06-06 DIAGNOSIS — F329 Major depressive disorder, single episode, unspecified: Secondary | ICD-10-CM | POA: Insufficient documentation

## 2016-06-06 DIAGNOSIS — J45909 Unspecified asthma, uncomplicated: Secondary | ICD-10-CM | POA: Diagnosis not present

## 2016-06-06 DIAGNOSIS — K219 Gastro-esophageal reflux disease without esophagitis: Secondary | ICD-10-CM | POA: Diagnosis not present

## 2016-06-06 DIAGNOSIS — M160 Bilateral primary osteoarthritis of hip: Secondary | ICD-10-CM | POA: Insufficient documentation

## 2016-06-06 DIAGNOSIS — M170 Bilateral primary osteoarthritis of knee: Secondary | ICD-10-CM | POA: Diagnosis not present

## 2016-06-06 DIAGNOSIS — F419 Anxiety disorder, unspecified: Secondary | ICD-10-CM | POA: Insufficient documentation

## 2016-06-06 HISTORY — PX: LAPAROSCOPIC TUBAL LIGATION: SHX1937

## 2016-06-06 LAB — POCT PREGNANCY, URINE: Preg Test, Ur: NEGATIVE

## 2016-06-06 LAB — POTASSIUM: POTASSIUM: 3.7 mmol/L (ref 3.5–5.1)

## 2016-06-06 LAB — URINE DRUG SCREEN, QUALITATIVE (ARMC ONLY)
AMPHETAMINES, UR SCREEN: NOT DETECTED
Barbiturates, Ur Screen: NOT DETECTED
Benzodiazepine, Ur Scrn: NOT DETECTED
CANNABINOID 50 NG, UR ~~LOC~~: POSITIVE — AB
COCAINE METABOLITE, UR ~~LOC~~: NOT DETECTED
MDMA (ECSTASY) UR SCREEN: NOT DETECTED
Methadone Scn, Ur: NOT DETECTED
Opiate, Ur Screen: NOT DETECTED
PHENCYCLIDINE (PCP) UR S: NOT DETECTED
Tricyclic, Ur Screen: POSITIVE — AB

## 2016-06-06 SURGERY — LIGATION, FALLOPIAN TUBE, LAPAROSCOPIC
Anesthesia: General | Laterality: Bilateral | Wound class: Clean

## 2016-06-06 MED ORDER — ROCURONIUM BROMIDE 100 MG/10ML IV SOLN
INTRAVENOUS | Status: DC | PRN
  Administered 2016-06-06 (×2): 10 mg via INTRAVENOUS

## 2016-06-06 MED ORDER — FENTANYL CITRATE (PF) 100 MCG/2ML IJ SOLN
INTRAMUSCULAR | Status: AC
  Filled 2016-06-06: qty 2

## 2016-06-06 MED ORDER — ONDANSETRON HCL 4 MG/2ML IJ SOLN
INTRAMUSCULAR | Status: DC | PRN
  Administered 2016-06-06: 4 mg via INTRAVENOUS

## 2016-06-06 MED ORDER — FENTANYL CITRATE (PF) 100 MCG/2ML IJ SOLN
25.0000 ug | INTRAMUSCULAR | Status: DC | PRN
  Administered 2016-06-06 (×2): 25 ug via INTRAVENOUS
  Administered 2016-06-06: 50 ug via INTRAVENOUS

## 2016-06-06 MED ORDER — BUPIVACAINE HCL (PF) 0.5 % IJ SOLN
INTRAMUSCULAR | Status: AC
  Filled 2016-06-06: qty 30

## 2016-06-06 MED ORDER — KETOROLAC TROMETHAMINE 30 MG/ML IJ SOLN
INTRAMUSCULAR | Status: DC | PRN
  Administered 2016-06-06: 30 mg via INTRAVENOUS

## 2016-06-06 MED ORDER — FAMOTIDINE 20 MG PO TABS
20.0000 mg | ORAL_TABLET | Freq: Once | ORAL | Status: AC
  Administered 2016-06-06: 20 mg via ORAL

## 2016-06-06 MED ORDER — PROPOFOL 10 MG/ML IV BOLUS
INTRAVENOUS | Status: DC | PRN
  Administered 2016-06-06: 150 mg via INTRAVENOUS

## 2016-06-06 MED ORDER — SUCCINYLCHOLINE CHLORIDE 20 MG/ML IJ SOLN
INTRAMUSCULAR | Status: DC | PRN
  Administered 2016-06-06: 100 mg via INTRAVENOUS

## 2016-06-06 MED ORDER — BUPIVACAINE HCL 0.5 % IJ SOLN
INTRAMUSCULAR | Status: DC | PRN
Start: 2016-06-06 — End: 2016-06-06
  Administered 2016-06-06: 10 mL

## 2016-06-06 MED ORDER — DEXAMETHASONE SODIUM PHOSPHATE 10 MG/ML IJ SOLN
INTRAMUSCULAR | Status: DC | PRN
  Administered 2016-06-06: 10 mg via INTRAVENOUS

## 2016-06-06 MED ORDER — MIDAZOLAM HCL 5 MG/5ML IJ SOLN
INTRAMUSCULAR | Status: DC | PRN
  Administered 2016-06-06: 2 mg via INTRAVENOUS
  Administered 2016-06-06: 1 mg via INTRAVENOUS

## 2016-06-06 MED ORDER — LACTATED RINGERS IV SOLN
INTRAVENOUS | Status: DC
  Administered 2016-06-06: 11:00:00 via INTRAVENOUS

## 2016-06-06 MED ORDER — SUGAMMADEX SODIUM 200 MG/2ML IV SOLN
INTRAVENOUS | Status: DC | PRN
  Administered 2016-06-06: 140 mg via INTRAVENOUS

## 2016-06-06 MED ORDER — OXYCODONE-ACETAMINOPHEN 5-325 MG PO TABS: 1.0000 | ORAL_TABLET | Freq: Four times a day (QID) | ORAL | Status: DC | PRN

## 2016-06-06 MED ORDER — FENTANYL CITRATE (PF) 100 MCG/2ML IJ SOLN
INTRAMUSCULAR | Status: DC | PRN
  Administered 2016-06-06: 100 ug via INTRAVENOUS
  Administered 2016-06-06 (×2): 50 ug via INTRAVENOUS

## 2016-06-06 MED ORDER — ONDANSETRON HCL 4 MG/2ML IJ SOLN: 4.0000 mg | Freq: Once | INTRAMUSCULAR | Status: DC | PRN

## 2016-06-06 MED ORDER — FAMOTIDINE 20 MG PO TABS
ORAL_TABLET | ORAL | Status: AC
  Administered 2016-06-06: 20 mg via ORAL
  Filled 2016-06-06: qty 1

## 2016-06-06 MED ORDER — IBUPROFEN 600 MG PO TABS: 600.0000 mg | ORAL_TABLET | Freq: Four times a day (QID) | ORAL | Status: DC | PRN

## 2016-06-06 MED ORDER — LIDOCAINE HCL (CARDIAC) 20 MG/ML IV SOLN
INTRAVENOUS | Status: DC | PRN
  Administered 2016-06-06: 60 mg via INTRAVENOUS

## 2016-06-06 SURGICAL SUPPLY — 19 items
BLADE SURG SZ11 CARB STEEL (BLADE) ×2 IMPLANT
CATH ROBINSON RED A/P 16FR (CATHETERS) ×2 IMPLANT
CHLORAPREP W/TINT 26ML (MISCELLANEOUS) ×2 IMPLANT
DRSG TEGADERM 2-3/8X2-3/4 SM (GAUZE/BANDAGES/DRESSINGS) ×2 IMPLANT
GLOVE BIO SURGEON STRL SZ7 (GLOVE) ×2 IMPLANT
GLOVE INDICATOR 7.5 STRL GRN (GLOVE) ×8 IMPLANT
GOWN STRL REUS W/ TWL LRG LVL3 (GOWN DISPOSABLE) ×3 IMPLANT
GOWN STRL REUS W/TWL LRG LVL3 (GOWN DISPOSABLE) ×3
KIT DISPOSABLE FALLOPE RING (Ring) ×2 IMPLANT
KIT RM TURNOVER CYSTO AR (KITS) ×2 IMPLANT
LABEL OR SOLS (LABEL) ×2 IMPLANT
LIQUID BAND (GAUZE/BANDAGES/DRESSINGS) ×2 IMPLANT
NS IRRIG 500ML POUR BTL (IV SOLUTION) ×2 IMPLANT
PACK GYN LAPAROSCOPIC (MISCELLANEOUS) ×2 IMPLANT
PAD OB MATERNITY 4.3X12.25 (PERSONAL CARE ITEMS) ×2 IMPLANT
PAD PREP 24X41 OB/GYN DISP (PERSONAL CARE ITEMS) ×2 IMPLANT
SUT MNCRL AB 4-0 PS2 18 (SUTURE) ×2 IMPLANT
TROCAR XCEL NON-BLD 5MMX100MML (ENDOMECHANICALS) ×2 IMPLANT
TUBING INSUFFLATOR HI FLOW (MISCELLANEOUS) ×2 IMPLANT

## 2016-06-06 NOTE — Discharge Instructions (Signed)

## 2016-06-06 NOTE — Transfer of Care (Signed)
Immediate Anesthesia Transfer of Care Note  Patient: Holly Middleton  Procedure(s) Performed: Procedure(s): LAPAROSCOPIC TUBAL LIGATION (Bilateral)  Patient Location: PACU  Anesthesia Type:General  Level of Consciousness: sedated  Airway & Oxygen Therapy: Patient Spontanous Breathing and Patient connected to face mask oxygen  Post-op Assessment: Report given to RN  Post vital signs: Reviewed and stable  Last Vitals:  Filed Vitals:   06/06/16 1008 06/06/16 1325  BP: 107/72 120/83  Pulse: 86 99  Temp: 36.7 C 36.3 C  Resp: 18 17    Last Pain:  Filed Vitals:   06/06/16 1327  PainSc: Asleep         Complications: No apparent anesthesia complications
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
## 2016-06-06 NOTE — Anesthesia Procedure Notes (Signed)
Procedure Name: Intubation Date/Time: 06/06/2016 12:41 PM Performed by: Lily KocherPERALTA, Stephenie Navejas Pre-anesthesia Checklist: Patient identified, Patient being monitored, Timeout performed, Emergency Drugs available and Suction available Patient Re-evaluated:Patient Re-evaluated prior to inductionOxygen Delivery Method: Circle system utilized Preoxygenation: Pre-oxygenation with 100% oxygen Intubation Type: IV induction Ventilation: Mask ventilation without difficulty Laryngoscope Size: Mac and 3 Grade View: Grade I Tube type: Oral Tube size: 7.0 mm Number of attempts: 1 Airway Equipment and Method: Stylet Placement Confirmation: ETT inserted through vocal cords under direct vision,  positive ETCO2 and breath sounds checked- equal and bilateral Secured at: 21 cm Tube secured with: Tape Dental Injury: Teeth and Oropharynx as per pre-operative assessment

## 2016-06-06 NOTE — H&P (Signed)
Date of Initial paper H&P: 05/30/2016  History reviewed, patient examined, no change in status, stable for surgery.

## 2016-06-06 NOTE — OR Nursing (Signed)
Patient states her pain level is 8/10.  Patient was offered pain medication on the stipulation that she needed to consume something before pain medication could be administered.  Patient refused any food; although she did drink 120 ounces of apple juice;  Patient states "i would like to go home and straight to bed.  I do not want anything here for pain. I will go home and have soup and take my pain medication after I wake up".  This RN instructed patient on pain medication and gave her the post op instructions regarding diet after surgery/anesthesia. Patient given instruction to call the surgeon's office with any problems/complications.   Patient request to go home in hospital gown. Patients mother present for all instruction.  Patient was discharged via wheelchair per protocol.  Patient in no acute distress; color wnl; respirations even and unlabored.  Will follow up with patient on Monday.

## 2016-06-06 NOTE — Anesthesia Postprocedure Evaluation (Signed)
Anesthesia Post Note  Patient: Holly Middleton  Procedure(s) Performed: Procedure(s) (LRB): LAPAROSCOPIC TUBAL LIGATION (Bilateral)  Patient location during evaluation: PACU Anesthesia Type: General Level of consciousness: awake and alert Pain management: pain level controlled Vital Signs Assessment: post-procedure vital signs reviewed and stable Respiratory status: spontaneous breathing, nonlabored ventilation, respiratory function stable and patient connected to nasal cannula oxygen Cardiovascular status: blood pressure returned to baseline and stable Postop Assessment: no signs of nausea or vomiting Anesthetic complications: no    Last Vitals:  Filed Vitals:   06/06/16 1355 06/06/16 1409  BP: 117/80 122/82  Pulse: 82 68  Temp:  36.6 C  Resp: 20 16    Last Pain:  Filed Vitals:   06/06/16 1411  PainSc: 8                  Lenard SimmerAndrew Maryon Kemnitz

## 2016-06-06 NOTE — Op Note (Signed)
Preoperative Diagnosis: 1) 30 y.o. desiring permanent surgical sterilization Postoperative Diagnosis: 1) 30 y.o. desiring permanent surgical sterilization  Operation Performed: Laparoscopic bilateral tubal ligation via falope ring application  Indication: 30 y.o. No obstetric history on file.  with undesired fertility, desires permanent sterilization.  Other reversible forms of contraception were discussed with patient; she declines all other modalities. Permanent nature of as well as associated risks of the procedure discussed with patient including but not limited to: risk of regret, permanence of method, bleeding, infection, injury to surrounding organs and need for additional procedures.  Failure risk of 0.5-1% with increased risk of ectopic gestation if pregnancy occurs was also discussed with patient.    Surgeon: Vena AustriaAndreas Oleg Oleson, MD  Anesthesia: General  Preoperative Antibiotics: none  Estimated Blood Loss: 5mL  IV Fluids: 600mL  Urine Output:: 30mL  Drains or Tubes: none  Implants: none  Specimens Removed: none  Complications: none  Intraoperative Findings: Normal tubes, ovaries, and uterus.  Good 1cm knuckle of tube applied within each falope ring.  Patient Condition: stable  Procedure in Detail:  Patient was taken to the operating room where she was administered general anesthesia.  She was positioned in the dorsal lithotomy position utilizing Allen stirups, prepped and draped in the usual sterile fashion.  Prior to proceeding with procedure a time out was performed.  Attention was turned to the patient's pelvis.  A red rubber catheter was used to empty the patient's bladder.  An operative speculum was placed to allow visualization of the cervix.  The anterior lip of the cervix was grasped with a single tooth tenaculum, and a Hulka tenaculum was placed to allow manipulation of the uterus.  The operative speculum and single tooth tenaculum were then removed.  Attention was  turned to the patient's abdomen.  The umbilicus was infiltrated with 1% Sensorcaine, before making a stab incision using an 11 blade scalpel.  A 5mm Excel trocar was then used to gain direct entry into the peritoneal cavity utilizing the camera to visualize progress of the trocar during placement.  Once peritoneal entry had been achieved, insufflation was started and pneumoperitoneum established at a pressure of 15mmHg.   General inspection of the abdomen revealed the above noted findings.  A spot 2cm midline above the pubic symphysis was injected with 1% Sensorcaine and a stab incision was made using an 11 blade scalpel.  The 8mm falope ring trocar was place suprapubic through this incision under direct visualization.  The right tube was identified and grasped in a mid-isthmic portion using the falope ring application.  The falope ring was applied with a good 1cm knuckle of blanching tube noted within the falope ring after application.  The left tube was identified and a second falope ring was applied in a similar fashion.  Pneumoperitoneum was evacuated.  The trocars were removed.  The 8mm trocar site was closed with 4-0 Monocryl in a subcuticular fashion.  All trocar sites were then dressed with surgical skin glue.  The Hulka tenaculum was removed.  Sponge needle and instrument counts were correct time two.  The patient tolerated the procedure well and was taken to the recovery room in stable condition.

## 2016-08-25 DIAGNOSIS — G44229 Chronic tension-type headache, not intractable: Secondary | ICD-10-CM

## 2016-11-18 ENCOUNTER — Ambulatory Visit
Admission: EM | Admit: 2016-11-18 | Discharge: 2016-11-18 | Disposition: A | Discharge status: Home / Self Care | Payer: Medicaid Other | Attending: Family Medicine | Admitting: Family Medicine

## 2016-11-18 ENCOUNTER — Encounter: Payer: Self-pay | Admitting: *Deleted

## 2016-11-18 DIAGNOSIS — X12XXXA Contact with other hot fluids, initial encounter: Secondary | ICD-10-CM

## 2016-11-18 DIAGNOSIS — T300 Burn of unspecified body region, unspecified degree: Secondary | ICD-10-CM | POA: Diagnosis not present

## 2016-11-18 MED ORDER — SILVER SULFADIAZINE 1 % EX CREA: 1.0000 "application " | TOPICAL_CREAM | Freq: Every day | CUTANEOUS | 0 refills | Status: DC
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
## 2017-04-29 ENCOUNTER — Encounter: Payer: Self-pay | Admitting: Obstetrics and Gynecology

## 2017-04-29 ENCOUNTER — Ambulatory Visit (INDEPENDENT_AMBULATORY_CARE_PROVIDER_SITE_OTHER): Payer: Medicaid Other | Admitting: Obstetrics and Gynecology

## 2017-04-29 VITALS — BP 100/54 | HR 78 | Ht 63.0 in | Wt 156.0 lb

## 2017-04-29 DIAGNOSIS — Z01419 Encounter for gynecological examination (general) (routine) without abnormal findings: Secondary | ICD-10-CM

## 2017-04-29 DIAGNOSIS — N644 Mastodynia: Secondary | ICD-10-CM | POA: Diagnosis not present

## 2017-04-29 DIAGNOSIS — N643 Galactorrhea not associated with childbirth: Secondary | ICD-10-CM

## 2017-04-29 DIAGNOSIS — O926 Galactorrhea: Secondary | ICD-10-CM | POA: Diagnosis not present

## 2017-04-29 DIAGNOSIS — Z0000 Encounter for general adult medical examination without abnormal findings: Secondary | ICD-10-CM | POA: Diagnosis not present

## 2017-04-29 DIAGNOSIS — N939 Abnormal uterine and vaginal bleeding, unspecified: Secondary | ICD-10-CM | POA: Diagnosis not present

## 2017-04-29 MED ORDER — MEDROXYPROGESTERONE ACETATE 10 MG PO TABS: 10.0000 mg | ORAL_TABLET | Freq: Every day | ORAL | 2 refills | Status: DC
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
## 2017-04-29 NOTE — Patient Instructions (Signed)
Abnormal Uterine Bleeding Abnormal uterine bleeding can affect women at various stages in life, including teenagers, women in their reproductive years, pregnant women, and women who have reached menopause. Several kinds of uterine bleeding are considered abnormal, including:  Bleeding or spotting between periods.  Bleeding after sexual intercourse.  Bleeding that is heavier or more than normal.  Periods that last longer than usual.  Bleeding after menopause. Many cases of abnormal uterine bleeding are minor and simple to treat, while others are more serious. Any type of abnormal bleeding should be evaluated by your health care provider. Treatment will depend on the cause of the bleeding. Follow these instructions at home: Monitor your condition for any changes. The following actions may help to alleviate any discomfort you are experiencing:  Avoid the use of tampons and douches as directed by your health care provider.  Change your pads frequently. You should get regular pelvic exams and Pap tests. Keep all follow-up appointments for diagnostic tests as directed by your health care provider. Contact a health care provider if:  Your bleeding lasts more than 1 week.  You feel dizzy at times. Get help right away if:  You pass out.  You are changing pads every 15 to 30 minutes.  You have abdominal pain.  You have a fever.  You become sweaty or weak.  You are passing large blood clots from the vagina.  You start to feel nauseous and vomit. This information is not intended to replace advice given to you by your health care provider. Make sure you discuss any questions you have with your health care provider. Document Released: 12/15/2005 Document Revised: 05/28/2016 Document Reviewed: 07/14/2013 Elsevier Interactive Patient Education  2017 [employer]  

## 2017-05-02 LAB — TSH+PRL+FSH+TESTT+LH+DHEA S...
17 HYDROXYPROGESTERONE: 29 ng/dL
Androstenedione: 128 ng/dL (ref 41–262)
DHEA SO4: 83.1 ug/dL — AB (ref 84.8–378.0)
FSH: 5.7 m[IU]/mL
LH: 10.6 m[IU]/mL
PROLACTIN: 14.2 ng/mL (ref 4.8–23.3)
TSH: 10.68 u[IU]/mL — ABNORMAL HIGH (ref 0.450–4.500)
Testosterone, Free: 0.7 pg/mL (ref 0.0–4.2)
Testosterone: 8 ng/dL (ref 8–48)

## 2017-05-17 ENCOUNTER — Emergency Department: Payer: Medicaid Other

## 2017-05-17 ENCOUNTER — Emergency Department
Admission: EM | Admit: 2017-05-17 | Discharge: 2017-05-17 | Disposition: A | Discharge status: Home / Self Care | Payer: Medicaid Other | Attending: Emergency Medicine | Admitting: Emergency Medicine

## 2017-05-17 DIAGNOSIS — J45909 Unspecified asthma, uncomplicated: Secondary | ICD-10-CM | POA: Diagnosis not present

## 2017-05-17 DIAGNOSIS — R1013 Epigastric pain: Secondary | ICD-10-CM | POA: Insufficient documentation

## 2017-05-17 DIAGNOSIS — R112 Nausea with vomiting, unspecified: Secondary | ICD-10-CM | POA: Diagnosis not present

## 2017-05-17 DIAGNOSIS — Z87891 Personal history of nicotine dependence: Secondary | ICD-10-CM | POA: Diagnosis not present

## 2017-05-17 DIAGNOSIS — R079 Chest pain, unspecified: Secondary | ICD-10-CM | POA: Diagnosis not present

## 2017-05-17 LAB — COMPREHENSIVE METABOLIC PANEL
ALBUMIN: 4.8 g/dL (ref 3.5–5.0)
ALT: 24 U/L (ref 14–54)
AST: 52 U/L — ABNORMAL HIGH (ref 15–41)
Alkaline Phosphatase: 89 U/L (ref 38–126)
Anion gap: 7 (ref 5–15)
BUN: 7 mg/dL (ref 6–20)
CALCIUM: 9.3 mg/dL (ref 8.9–10.3)
CO2: 25 mmol/L (ref 22–32)
CREATININE: 0.72 mg/dL (ref 0.44–1.00)
Chloride: 104 mmol/L (ref 101–111)
GFR calc Af Amer: >60 mL/min (ref >60)
GFR calc non Af Amer: >60 mL/min (ref >60)
Glucose, Bld: 105 mg/dL — ABNORMAL HIGH (ref 65–99)
Potassium: 2.9 mmol/L — ABNORMAL LOW (ref 3.5–5.1)
SODIUM: 136 mmol/L (ref 135–145)
Total Bilirubin: 0.8 mg/dL (ref 0.3–1.2)
Total Protein: 8.8 g/dL — ABNORMAL HIGH (ref 6.5–8.1)

## 2017-05-17 LAB — URINALYSIS, COMPLETE (UACMP) WITH MICROSCOPIC
Bilirubin Urine: NEGATIVE
Glucose, UA: NEGATIVE mg/dL
KETONES UR: NEGATIVE mg/dL
NITRITE: NEGATIVE
Protein, ur: 30 mg/dL — AB
SPECIFIC GRAVITY, URINE: 1.023 (ref 1.005–1.030)
pH: 5 (ref 5.0–8.0)

## 2017-05-17 LAB — CBC WITH DIFFERENTIAL/PLATELET
BASOS PCT: 1 %
Basophils Absolute: 0 10*3/uL (ref 0–0.1)
EOS ABS: 0 10*3/uL (ref 0–0.7)
Eosinophils Relative: 0 %
HCT: 37.2 % (ref 35.0–47.0)
HEMOGLOBIN: 12.9 g/dL (ref 12.0–16.0)
Lymphocytes Relative: 27 %
Lymphs Abs: 1.9 10*3/uL (ref 1.0–3.6)
MCH: 30 pg (ref 26.0–34.0)
MCHC: 34.5 g/dL (ref 32.0–36.0)
MCV: 86.9 fL (ref 80.0–100.0)
Monocytes Absolute: 0.6 10*3/uL (ref 0.2–0.9)
Monocytes Relative: 9 %
NEUTROS PCT: 63 %
Neutro Abs: 4.4 10*3/uL (ref 1.4–6.5)
Platelets: 287 10*3/uL (ref 150–440)
RBC: 4.29 MIL/uL (ref 3.80–5.20)
RDW: 14.8 % — ABNORMAL HIGH (ref 11.5–14.5)
WBC: 7 10*3/uL (ref 3.6–11.0)

## 2017-05-17 LAB — LIPASE, BLOOD: Lipase: 27 U/L (ref 11–51)

## 2017-05-17 LAB — POCT PREGNANCY, URINE: PREG TEST UR: NEGATIVE

## 2017-05-17 LAB — TROPONIN I: Troponin I: <0.03 ng/mL (ref <0.03)

## 2017-05-17 MED ORDER — SODIUM CHLORIDE 0.9 % IV BOLUS (SEPSIS)
1000.0000 mL | Freq: Once | INTRAVENOUS | Status: AC
  Administered 2017-05-17: 1000 mL via INTRAVENOUS

## 2017-05-17 MED ORDER — MORPHINE SULFATE (PF) 4 MG/ML IV SOLN: 4.0000 mg | Freq: Once | INTRAVENOUS | Status: DC

## 2017-05-17 MED ORDER — ONDANSETRON HCL 4 MG/2ML IJ SOLN: 4.0000 mg | Freq: Once | INTRAMUSCULAR | Status: DC

## 2017-05-17 NOTE — ED Notes (Signed)
Patient transported to X-ray 
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
## 2017-05-17 NOTE — ED Notes (Signed)
Reviewed d/c instructions, follow-up care with patient. PT verbalized understanding 

## 2017-05-17 NOTE — ED Triage Notes (Signed)
Per EMS: Patient c/o abdominal/epigastric pain radiating up to left breast. EMS witnessed 3 episodes of vomiting, patient reports 3-4 more.   Patient denies current pain. Pt reports similar episode in the past, and was diagnosed with esophageal spasm. Patient denies possibility of pregnancy due to tubal ligation.

## 2017-05-17 NOTE — ED Notes (Signed)
Patient returned from xray.

## 2017-06-11 ENCOUNTER — Emergency Department: Payer: Medicaid Other

## 2017-06-11 ENCOUNTER — Observation Stay
Admission: EM | Admit: 2017-06-11 | Discharge: 2017-06-12 | Disposition: A | Discharge status: Home / Self Care | Payer: Medicaid Other | Attending: Internal Medicine | Admitting: Internal Medicine

## 2017-06-11 DIAGNOSIS — L659 Nonscarring hair loss, unspecified: Secondary | ICD-10-CM | POA: Diagnosis not present

## 2017-06-11 DIAGNOSIS — Z7951 Long term (current) use of inhaled steroids: Secondary | ICD-10-CM | POA: Diagnosis not present

## 2017-06-11 DIAGNOSIS — G43909 Migraine, unspecified, not intractable, without status migrainosus: Secondary | ICD-10-CM | POA: Diagnosis not present

## 2017-06-11 DIAGNOSIS — R112 Nausea with vomiting, unspecified: Secondary | ICD-10-CM | POA: Diagnosis not present

## 2017-06-11 DIAGNOSIS — F329 Major depressive disorder, single episode, unspecified: Secondary | ICD-10-CM | POA: Insufficient documentation

## 2017-06-11 DIAGNOSIS — D72828 Other elevated white blood cell count: Secondary | ICD-10-CM | POA: Insufficient documentation

## 2017-06-11 DIAGNOSIS — F419 Anxiety disorder, unspecified: Secondary | ICD-10-CM | POA: Insufficient documentation

## 2017-06-11 DIAGNOSIS — R51 Headache: Secondary | ICD-10-CM

## 2017-06-11 DIAGNOSIS — Z87891 Personal history of nicotine dependence: Secondary | ICD-10-CM | POA: Diagnosis not present

## 2017-06-11 DIAGNOSIS — H547 Unspecified visual loss: Secondary | ICD-10-CM | POA: Insufficient documentation

## 2017-06-11 DIAGNOSIS — S01512A Laceration without foreign body of oral cavity, initial encounter: Secondary | ICD-10-CM | POA: Diagnosis not present

## 2017-06-11 DIAGNOSIS — Z79899 Other long term (current) drug therapy: Secondary | ICD-10-CM | POA: Diagnosis not present

## 2017-06-11 DIAGNOSIS — X58XXXA Exposure to other specified factors, initial encounter: Secondary | ICD-10-CM | POA: Diagnosis not present

## 2017-06-11 DIAGNOSIS — E039 Hypothyroidism, unspecified: Secondary | ICD-10-CM

## 2017-06-11 DIAGNOSIS — G40909 Epilepsy, unspecified, not intractable, without status epilepticus: Principal | ICD-10-CM | POA: Insufficient documentation

## 2017-06-11 DIAGNOSIS — E876 Hypokalemia: Secondary | ICD-10-CM | POA: Diagnosis not present

## 2017-06-11 DIAGNOSIS — R569 Unspecified convulsions: Secondary | ICD-10-CM

## 2017-06-11 DIAGNOSIS — E890 Postprocedural hypothyroidism: Secondary | ICD-10-CM | POA: Insufficient documentation

## 2017-06-11 DIAGNOSIS — J45909 Unspecified asthma, uncomplicated: Secondary | ICD-10-CM | POA: Diagnosis not present

## 2017-06-11 DIAGNOSIS — G43901 Migraine, unspecified, not intractable, with status migrainosus: Secondary | ICD-10-CM

## 2017-06-11 DIAGNOSIS — H538 Other visual disturbances: Secondary | ICD-10-CM

## 2017-06-11 DIAGNOSIS — Z87820 Personal history of traumatic brain injury: Secondary | ICD-10-CM | POA: Insufficient documentation

## 2017-06-11 DIAGNOSIS — G44229 Chronic tension-type headache, not intractable: Secondary | ICD-10-CM

## 2017-06-11 LAB — CBC
HEMATOCRIT: 35.6 % (ref 35.0–47.0)
Hemoglobin: 11.9 g/dL — ABNORMAL LOW (ref 12.0–16.0)
MCH: 28.8 pg (ref 26.0–34.0)
MCHC: 33.4 g/dL (ref 32.0–36.0)
MCV: 86.2 fL (ref 80.0–100.0)
Platelets: 294 10*3/uL (ref 150–440)
RBC: 4.13 MIL/uL (ref 3.80–5.20)
RDW: 14.5 % (ref 11.5–14.5)
WBC: 11.1 10*3/uL — ABNORMAL HIGH (ref 3.6–11.0)

## 2017-06-11 LAB — MAGNESIUM: Magnesium: 1.9 mg/dL (ref 1.7–2.4)

## 2017-06-11 LAB — COMPREHENSIVE METABOLIC PANEL
ALT: 58 U/L — ABNORMAL HIGH (ref 14–54)
ANION GAP: 7 (ref 5–15)
AST: 62 U/L — ABNORMAL HIGH (ref 15–41)
Albumin: 4.3 g/dL (ref 3.5–5.0)
Alkaline Phosphatase: 59 U/L (ref 38–126)
BILIRUBIN TOTAL: 0.6 mg/dL (ref 0.3–1.2)
BUN: 8 mg/dL (ref 6–20)
CALCIUM: 8.7 mg/dL — AB (ref 8.9–10.3)
CO2: 25 mmol/L (ref 22–32)
Chloride: 103 mmol/L (ref 101–111)
Creatinine, Ser: 0.43 mg/dL — ABNORMAL LOW (ref 0.44–1.00)
GFR calc Af Amer: >60 mL/min (ref >60)
Glucose, Bld: 95 mg/dL (ref 65–99)
POTASSIUM: 2.8 mmol/L — AB (ref 3.5–5.1)
Sodium: 135 mmol/L (ref 135–145)
TOTAL PROTEIN: 7.4 g/dL (ref 6.5–8.1)

## 2017-06-11 LAB — TSH: TSH: 162 u[IU]/mL — ABNORMAL HIGH (ref 0.350–4.500)

## 2017-06-11 LAB — T4, FREE

## 2017-06-11 LAB — VALPROIC ACID LEVEL

## 2017-06-11 MED ORDER — ONDANSETRON HCL 4 MG PO TABS: 4.0000 mg | ORAL_TABLET | Freq: Four times a day (QID) | ORAL | Status: DC | PRN

## 2017-06-11 MED ORDER — ENOXAPARIN SODIUM 40 MG/0.4ML ~~LOC~~ SOLN: 40.0000 mg | SUBCUTANEOUS | Status: DC

## 2017-06-11 MED ORDER — PROCHLORPERAZINE EDISYLATE 5 MG/ML IJ SOLN
10.0000 mg | Freq: Once | INTRAMUSCULAR | Status: AC
  Administered 2017-06-11: 10 mg via INTRAVENOUS
  Filled 2017-06-11: qty 2

## 2017-06-11 MED ORDER — ALBUTEROL SULFATE HFA 108 (90 BASE) MCG/ACT IN AERS: 2.0000 | INHALATION_SPRAY | Freq: Three times a day (TID) | RESPIRATORY_TRACT | Status: DC

## 2017-06-11 MED ORDER — LEVOTHYROXINE SODIUM 50 MCG PO TABS
175.0000 ug | ORAL_TABLET | Freq: Every day | ORAL | Status: DC
  Administered 2017-06-12: 175 ug via ORAL
  Filled 2017-06-11: qty 1

## 2017-06-11 MED ORDER — LORAZEPAM 2 MG/ML IJ SOLN
INTRAMUSCULAR | Status: AC
  Administered 2017-06-11: 0.5 mg via INTRAVENOUS
  Filled 2017-06-11: qty 1

## 2017-06-11 MED ORDER — OLOPATADINE HCL 0.1 % OP SOLN
1.0000 [drp] | Freq: Two times a day (BID) | OPHTHALMIC | Status: DC
  Administered 2017-06-11 – 2017-06-12 (×2): 1 [drp] via OPHTHALMIC
  Filled 2017-06-11: qty 5

## 2017-06-11 MED ORDER — POTASSIUM CHLORIDE 10 MEQ/100ML IV SOLN
10.0000 meq | INTRAVENOUS | Status: AC
  Administered 2017-06-11 (×3): 10 meq via INTRAVENOUS
  Filled 2017-06-11: qty 100

## 2017-06-11 MED ORDER — POTASSIUM CHLORIDE IN NACL 40-0.9 MEQ/L-% IV SOLN
INTRAVENOUS | Status: DC
  Administered 2017-06-11 – 2017-06-12 (×2): 75 mL/h via INTRAVENOUS
  Filled 2017-06-11 (×3): qty 1000

## 2017-06-11 MED ORDER — LORAZEPAM 2 MG/ML IJ SOLN
0.5000 mg | Freq: Once | INTRAMUSCULAR | Status: AC
  Administered 2017-06-11: 0.5 mg via INTRAVENOUS

## 2017-06-11 MED ORDER — LEVETIRACETAM 750 MG PO TABS
750.0000 mg | ORAL_TABLET | Freq: Two times a day (BID) | ORAL | Status: DC
Start: 2017-06-12 — End: 2017-06-12

## 2017-06-11 MED ORDER — ASPIRIN EC 81 MG PO TBEC
81.0000 mg | DELAYED_RELEASE_TABLET | Freq: Every day | ORAL | Status: DC
  Administered 2017-06-12: 81 mg via ORAL
  Filled 2017-06-11: qty 1

## 2017-06-11 MED ORDER — LORATADINE 10 MG PO TABS: 10.0000 mg | ORAL_TABLET | Freq: Every day | ORAL | Status: DC | PRN

## 2017-06-11 MED ORDER — ONDANSETRON HCL 4 MG/2ML IJ SOLN: 4.0000 mg | Freq: Four times a day (QID) | INTRAMUSCULAR | Status: DC | PRN

## 2017-06-11 MED ORDER — SODIUM CHLORIDE 0.9 % IV SOLN
1000.0000 mg | Freq: Two times a day (BID) | INTRAVENOUS | Status: DC
  Administered 2017-06-12: 06:00:00 1000 mg via INTRAVENOUS
  Filled 2017-06-11 (×2): qty 10

## 2017-06-11 MED ORDER — LORATADINE 10 MG PO TABS: 10.0000 mg | ORAL_TABLET | Freq: Every day | ORAL | Status: DC

## 2017-06-11 MED ORDER — ACETAMINOPHEN 325 MG PO TABS: 650.0000 mg | ORAL_TABLET | Freq: Four times a day (QID) | ORAL | Status: DC | PRN

## 2017-06-11 MED ORDER — TOPIRAMATE 25 MG PO TABS: 50.0000 mg | ORAL_TABLET | Freq: Every day | ORAL | Status: DC

## 2017-06-11 MED ORDER — SODIUM CHLORIDE 0.9 % IV BOLUS (SEPSIS)
1000.0000 mL | Freq: Once | INTRAVENOUS | Status: AC
  Administered 2017-06-11: 1000 mL via INTRAVENOUS

## 2017-06-11 MED ORDER — DIVALPROEX SODIUM 250 MG PO DR TAB
250.0000 mg | DELAYED_RELEASE_TABLET | Freq: Three times a day (TID) | ORAL | Status: DC
  Administered 2017-06-11 – 2017-06-12 (×2): 250 mg via ORAL
  Filled 2017-06-11 (×2): qty 1

## 2017-06-11 MED ORDER — LORAZEPAM 2 MG/ML IJ SOLN: 2.0000 mg | INTRAMUSCULAR | Status: DC | PRN

## 2017-06-11 MED ORDER — KETOROLAC TROMETHAMINE 30 MG/ML IJ SOLN
30.0000 mg | Freq: Once | INTRAMUSCULAR | Status: AC
  Administered 2017-06-11: 30 mg via INTRAVENOUS
  Filled 2017-06-11: qty 1

## 2017-06-11 MED ORDER — ACETAMINOPHEN 650 MG RE SUPP: 650.0000 mg | Freq: Four times a day (QID) | RECTAL | Status: DC | PRN

## 2017-06-11 MED ORDER — MAGNESIUM SULFATE 4 GM/100ML IV SOLN
4.0000 g | Freq: Once | INTRAVENOUS | Status: DC
Start: 2017-06-11 — End: 2017-06-12

## 2017-06-11 MED ORDER — ALBUTEROL SULFATE (2.5 MG/3ML) 0.083% IN NEBU: 2.5000 mg | INHALATION_SOLUTION | Freq: Four times a day (QID) | RESPIRATORY_TRACT | Status: DC | PRN

## 2017-06-11 MED ORDER — LORAZEPAM 2 MG/ML IJ SOLN
1.0000 mg | Freq: Once | INTRAMUSCULAR | Status: AC
  Administered 2017-06-11: 1 mg via INTRAVENOUS

## 2017-06-11 MED ORDER — POTASSIUM CHLORIDE 10 MEQ/100ML IV SOLN
10.0000 meq | INTRAVENOUS | Status: AC
  Administered 2017-06-11: 10 meq via INTRAVENOUS
  Filled 2017-06-11 (×4): qty 100

## 2017-06-11 MED ORDER — TOPIRAMATE 25 MG PO TABS
50.0000 mg | ORAL_TABLET | Freq: Every day | ORAL | Status: DC
  Administered 2017-06-11: 22:00:00 50 mg via ORAL
  Filled 2017-06-11: qty 2

## 2017-06-11 MED ORDER — LEVETIRACETAM 750 MG PO TABS: 750.0000 mg | ORAL_TABLET | Freq: Two times a day (BID) | ORAL | Status: DC

## 2017-06-11 MED ORDER — FLUTICASONE PROPIONATE 50 MCG/ACT NA SUSP
2.0000 | Freq: Every day | NASAL | Status: DC | PRN
  Filled 2017-06-11: qty 16

## 2017-06-11 MED ORDER — OXYCODONE-ACETAMINOPHEN 5-325 MG PO TABS
1.0000 | ORAL_TABLET | ORAL | Status: DC | PRN
  Administered 2017-06-11: 1 via ORAL
  Filled 2017-06-11: qty 1

## 2017-06-11 MED ORDER — MEDROXYPROGESTERONE ACETATE 10 MG PO TABS
10.0000 mg | ORAL_TABLET | Freq: Every day | ORAL | Status: DC
  Administered 2017-06-12: 09:00:00 10 mg via ORAL
  Filled 2017-06-11: qty 1

## 2017-06-11 MED ORDER — DOCUSATE SODIUM 100 MG PO CAPS
100.0000 mg | ORAL_CAPSULE | Freq: Two times a day (BID) | ORAL | Status: DC
  Administered 2017-06-11 – 2017-06-12 (×2): 100 mg via ORAL
  Filled 2017-06-11 (×2): qty 1

## 2017-06-11 MED ORDER — DIPHENHYDRAMINE HCL 50 MG/ML IJ SOLN
12.5000 mg | Freq: Once | INTRAMUSCULAR | Status: AC
  Administered 2017-06-11: 12.5 mg via INTRAVENOUS
  Filled 2017-06-11: qty 1

## 2017-06-11 NOTE — ED Notes (Signed)
This nurse in room ; pt stared off and clinched fists . Notified MD to come to bedside immediately. Pt further evaluated seizure lasted 30 seconds without LOC. Pt then moved to room 18 for further monitoring
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
## 2017-06-11 NOTE — ED Triage Notes (Signed)
Pt comes into the ED via EMS from home with c/o migraine. States she has severe migraine [facility].Marland Kitchen. Pt is tearful on arrival..
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
## 2017-06-11 NOTE — ED Notes (Signed)
Patient back from CT.

## 2017-06-11 NOTE — Progress Notes (Signed)
Patient ID: Holly ChamberErika A Huntoon, female   DOB: November 25, 1986, 31 y.o.   MRN: [id number] Because patient's valproic acid level was below 10, repeated valproic acid level will be checked tomorrow morning, patient will be supplemented with Keppra intravenously for 2 doses before resumption of her oral medications. It is assumed that patient is noncompliant with her antiepileptic medications.

## 2017-06-12 DIAGNOSIS — R569 Unspecified convulsions: Secondary | ICD-10-CM

## 2017-06-12 LAB — CBC
HCT: 34.9 % — ABNORMAL LOW (ref 35.0–47.0)
Hemoglobin: 11.5 g/dL — ABNORMAL LOW (ref 12.0–16.0)
MCH: 28.6 pg (ref 26.0–34.0)
MCHC: 33.1 g/dL (ref 32.0–36.0)
MCV: 86.4 fL (ref 80.0–100.0)
PLATELETS: 222 10*3/uL (ref 150–440)
RBC: 4.04 MIL/uL (ref 3.80–5.20)
RDW: 14.1 % (ref 11.5–14.5)
WBC: 12.2 10*3/uL — ABNORMAL HIGH (ref 3.6–11.0)

## 2017-06-12 LAB — URINE DRUG SCREEN, QUALITATIVE (ARMC ONLY)
AMPHETAMINES, UR SCREEN: NOT DETECTED
BENZODIAZEPINE, UR SCRN: NOT DETECTED
Barbiturates, Ur Screen: NOT DETECTED
COCAINE METABOLITE, UR ~~LOC~~: NOT DETECTED
Cannabinoid 50 Ng, Ur ~~LOC~~: POSITIVE — AB
MDMA (ECSTASY) UR SCREEN: NOT DETECTED
METHADONE SCREEN, URINE: NOT DETECTED
OPIATE, UR SCREEN: NOT DETECTED
Phencyclidine (PCP) Ur S: NOT DETECTED
Tricyclic, Ur Screen: NOT DETECTED

## 2017-06-12 LAB — PREGNANCY, URINE: Preg Test, Ur: NEGATIVE

## 2017-06-12 LAB — BASIC METABOLIC PANEL
Anion gap: 6 (ref 5–15)
BUN: <5 mg/dL — ABNORMAL LOW (ref 6–20)
CALCIUM: 8.2 mg/dL — AB (ref 8.9–10.3)
CO2: 22 mmol/L (ref 22–32)
CREATININE: 0.53 mg/dL (ref 0.44–1.00)
Chloride: 109 mmol/L (ref 101–111)
GFR calc Af Amer: >60 mL/min (ref >60)
GFR calc non Af Amer: >60 mL/min (ref >60)
GLUCOSE: 78 mg/dL (ref 65–99)
Potassium: 3.6 mmol/L (ref 3.5–5.1)
Sodium: 137 mmol/L (ref 135–145)

## 2017-06-12 LAB — GLUCOSE, CAPILLARY: Glucose-Capillary: 79 mg/dL (ref 65–99)

## 2017-06-12 LAB — VALPROIC ACID LEVEL: VALPROIC ACID LVL: 24 ug/mL — AB (ref 50.0–100.0)

## 2017-06-12 MED ORDER — DIVALPROEX SODIUM 500 MG PO DR TAB: 500.0000 mg | DELAYED_RELEASE_TABLET | Freq: Two times a day (BID) | ORAL | Status: DC

## 2017-06-12 MED ORDER — DIVALPROEX SODIUM 250 MG PO DR TAB: 500.0000 mg | DELAYED_RELEASE_TABLET | Freq: Two times a day (BID) | ORAL | 0 refills | Status: DC
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
## 2017-06-12 NOTE — Progress Notes (Signed)
Discussed discharge instructions and medications with pt and her mother. IV removed. All questions addressed. Pt transported home via car by her mother.  Danielle Jamien Casanova, RN 
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
## 2017-06-13 LAB — HIV ANTIBODY (ROUTINE TESTING W REFLEX): HIV Screen 4th Generation wRfx: NONREACTIVE

## 2017-06-14 LAB — LEVETIRACETAM LEVEL: LEVETIRACETAM: NOT DETECTED ug/mL (ref 10.0–40.0)

## 2017-07-06 DIAGNOSIS — Z7689 Persons encountering health services in other specified circumstances: Secondary | ICD-10-CM | POA: Insufficient documentation

## 2017-12-17 ENCOUNTER — Ambulatory Visit: Payer: Self-pay | Admitting: Physician Assistant

## 2017-12-23 ENCOUNTER — Encounter: Payer: Self-pay | Admitting: Physician Assistant

## 2017-12-23 ENCOUNTER — Ambulatory Visit (INDEPENDENT_AMBULATORY_CARE_PROVIDER_SITE_OTHER): Payer: Medicaid Other | Admitting: Physician Assistant

## 2017-12-23 VITALS — BP 104/60 | HR 61 | Temp 97.8°F | Resp 16 | Ht 63.0 in | Wt 148.0 lb

## 2017-12-23 DIAGNOSIS — S0990XS Unspecified injury of head, sequela: Secondary | ICD-10-CM

## 2017-12-23 DIAGNOSIS — E876 Hypokalemia: Secondary | ICD-10-CM

## 2017-12-23 DIAGNOSIS — Z13220 Encounter for screening for lipoid disorders: Secondary | ICD-10-CM

## 2017-12-23 DIAGNOSIS — R569 Unspecified convulsions: Secondary | ICD-10-CM

## 2017-12-23 DIAGNOSIS — D649 Anemia, unspecified: Secondary | ICD-10-CM

## 2017-12-23 DIAGNOSIS — F329 Major depressive disorder, single episode, unspecified: Secondary | ICD-10-CM | POA: Diagnosis not present

## 2017-12-23 DIAGNOSIS — F32A Depression, unspecified: Secondary | ICD-10-CM
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
## 2017-12-23 NOTE — Patient Instructions (Addendum)
Please follow up with Dr. Gabriel Carina to check on hypothyroidism  Health Maintenance, Female Adopting a healthy lifestyle and getting preventive care can go a long way to promote health and wellness. Talk with your health care provider about what schedule of regular examinations is right for you. This is a good chance for you to check in with your provider about disease prevention and staying healthy. In between checkups, there are plenty of things you can do on your own. Experts have done a lot of research about which lifestyle changes and preventive measures are most likely to keep you healthy. Ask your health care provider for more information. Weight and diet Eat a healthy diet  Be sure to include plenty of vegetables, fruits, low-fat dairy products, and lean protein.  Do not eat a lot of foods high in solid fats, added sugars, or salt.  Get regular exercise. This is one of the most important things you can do for your health. ? Most adults should exercise for at least 150 minutes each week. The exercise should increase your heart rate and make you sweat (moderate-intensity exercise). ? Most adults should also do strengthening exercises at least twice a week. This is in addition to the moderate-intensity exercise.  Maintain a healthy weight  Body mass index (BMI) is a measurement that can be used to identify possible weight problems. It estimates body fat based on height and weight. Your health care provider can help determine your BMI and help you achieve or maintain a healthy weight.  For females 39 years of age and older: ? A BMI below 18.5 is considered underweight. ? A BMI of 18.5 to 24.9 is normal. ? A BMI of 25 to 29.9 is considered overweight. ? A BMI of 30 and above is considered obese.  Watch levels of cholesterol and blood lipids  You should start having your blood tested for lipids and cholesterol at 32 years of age, then have this test every 5 years.  You may need to have  your cholesterol levels checked more often if: ? Your lipid or cholesterol levels are high. ? You are older than 32 years of age. ? You are at high risk for heart disease.  Cancer screening Lung Cancer  Lung cancer screening is recommended for adults 93-59 years old who are at high risk for lung cancer because of a history of smoking.  A yearly low-dose CT scan of the lungs is recommended for people who: ? Currently smoke. ? Have quit within the past 15 years. ? Have at least a 30-pack-year history of smoking. A pack year is smoking an average of one pack of cigarettes a day for 1 year.  Yearly screening should continue until it has been 15 years since you quit.  Yearly screening should stop if you develop a health problem that would prevent you from having lung cancer treatment.  Breast Cancer  Practice breast self-awareness. This means understanding how your breasts normally appear and feel.  It also means doing regular breast self-exams. Let your health care provider know about any changes, no matter how small.  If you are in your 20s or 30s, you should have a clinical breast exam (CBE) by a health care provider every 1-3 years as part of a regular health exam.  If you are 78 or older, have a CBE every year. Also consider having a breast X-ray (mammogram) every year.  If you have a family history of breast cancer, talk to your health care  provider about genetic screening.  If you are at high risk for breast cancer, talk to your health care provider about having an MRI and a mammogram every year.  Breast cancer gene (BRCA) assessment is recommended for women who have family members with BRCA-related cancers. BRCA-related cancers include: ? Breast. ? Ovarian. ? Tubal. ? Peritoneal cancers.  Results of the assessment will determine the need for genetic counseling and BRCA1 and BRCA2 testing.  Cervical Cancer Your health care provider may recommend that you be screened  regularly for cancer of the pelvic organs (ovaries, uterus, and vagina). This screening involves a pelvic examination, including checking for microscopic changes to the surface of your cervix (Pap test). You may be encouraged to have this screening done every 3 years, beginning at age 21.  For women ages 30-65, health care providers may recommend pelvic exams and Pap testing every 3 years, or they may recommend the Pap and pelvic exam, combined with testing for human papilloma virus (HPV), every 5 years. Some types of HPV increase your risk of cervical cancer. Testing for HPV may also be done on women of any age with unclear Pap test results.  Other health care providers may not recommend any screening for nonpregnant women who are considered low risk for pelvic cancer and who do not have symptoms. Ask your health care provider if a screening pelvic exam is right for you.  If you have had past treatment for cervical cancer or a condition that could lead to cancer, you need Pap tests and screening for cancer for at least 20 years after your treatment. If Pap tests have been discontinued, your risk factors (such as having a new sexual partner) need to be reassessed to determine if screening should resume. Some women have medical problems that increase the chance of getting cervical cancer. In these cases, your health care provider may recommend more frequent screening and Pap tests.  Colorectal Cancer  This type of cancer can be detected and often prevented.  Routine colorectal cancer screening usually begins at 32 years of age and continues through 32 years of age.  Your health care provider may recommend screening at an earlier age if you have risk factors for colon cancer.  Your health care provider may also recommend using home test kits to check for hidden blood in the stool.  A small camera at the end of a tube can be used to examine your colon directly (sigmoidoscopy or colonoscopy). This is  done to check for the earliest forms of colorectal cancer.  Routine screening usually begins at age 50.  Direct examination of the colon should be repeated every 5-10 years through 32 years of age. However, you may need to be screened more often if early forms of precancerous polyps or small growths are found.  Skin Cancer  Check your skin from head to toe regularly.  Tell your health care provider about any new moles or changes in moles, especially if there is a change in a mole's shape or color.  Also tell your health care provider if you have a mole that is larger than the size of a pencil eraser.  Always use sunscreen. Apply sunscreen liberally and repeatedly throughout the day.  Protect yourself by wearing long sleeves, pants, a wide-brimmed hat, and sunglasses whenever you are outside.  Heart disease, diabetes, and high blood pressure  High blood pressure causes heart disease and increases the risk of stroke. High blood pressure is more likely to develop in: ?   People who have blood pressure in the high end of the normal range (130-139/85-89 mm Hg). ? People who are overweight or obese. ? People who are African American.  If you are 31-71 years of age, have your blood pressure checked every 3-5 years. If you are 81 years of age or older, have your blood pressure checked every year. You should have your blood pressure measured twice--once when you are at a hospital or clinic, and once when you are not at a hospital or clinic. Record the average of the two measurements. To check your blood pressure when you are not at a hospital or clinic, you can use: ? An automated blood pressure machine at a pharmacy. ? A home blood pressure monitor.  If you are between 85 years and 60 years old, ask your health care provider if you should take aspirin to prevent strokes.  Have regular diabetes screenings. This involves taking a blood sample to check your fasting blood sugar level. ? If you are  at a normal weight and have a low risk for diabetes, have this test once every three years after 32 years of age. ? If you are overweight and have a high risk for diabetes, consider being tested at a younger age or more often. Preventing infection Hepatitis B  If you have a higher risk for hepatitis B, you should be screened for this virus. You are considered at high risk for hepatitis B if: ? You were born in a country where hepatitis B is common. Ask your health care provider which countries are considered high risk. ? Your parents were born in a high-risk country, and you have not been immunized against hepatitis B (hepatitis B vaccine). ? You have HIV or AIDS. ? You use needles to inject street drugs. ? You live with someone who has hepatitis B. ? You have had sex with someone who has hepatitis B. ? You get hemodialysis treatment. ? You take certain medicines for conditions, including cancer, organ transplantation, and autoimmune conditions.  Hepatitis C  Blood testing is recommended for: ? Everyone born from [birth year] through 1966. ? Anyone with known risk factors for hepatitis C.  Sexually transmitted infections (STIs)  You should be screened for sexually transmitted infections (STIs) including gonorrhea and chlamydia if: ? You are sexually active and are younger than 32 years of age. ? You are older than 32 years of age and your health care provider tells you that you are at risk for this type of infection. ? Your sexual activity has changed since you were last screened and you are at an increased risk for chlamydia or gonorrhea. Ask your health care provider if you are at risk.  If you do not have HIV, but are at risk, it may be recommended that you take a prescription medicine daily to prevent HIV infection. This is called pre-exposure prophylaxis (PrEP). You are considered at risk if: ? You are sexually active and do not regularly use condoms or know the HIV status of your  partner(s). ? You take drugs by injection. ? You are sexually active with a partner who has HIV.  Talk with your health care provider about whether you are at high risk of being infected with HIV. If you choose to begin PrEP, you should first be tested for HIV. You should then be tested every 3 months for as long as you are taking PrEP. Pregnancy  If you are premenopausal and you may become pregnant, ask your health  care provider about preconception counseling.  If you may become pregnant, take 400 to 800 micrograms (mcg) of folic acid every day.  If you want to prevent pregnancy, talk to your health care provider about birth control (contraception). Osteoporosis and menopause  Osteoporosis is a disease in which the bones lose minerals and strength with aging. This can result in serious bone fractures. Your risk for osteoporosis can be identified using a bone density scan.  If you are 65 years of age or older, or if you are at risk for osteoporosis and fractures, ask your health care provider if you should be screened.  Ask your health care provider whether you should take a calcium or vitamin D supplement to lower your risk for osteoporosis.  Menopause may have certain physical symptoms and risks.  Hormone replacement therapy may reduce some of these symptoms and risks. Talk to your health care provider about whether hormone replacement therapy is right for you. Follow these instructions at home:  Schedule regular health, dental, and eye exams.  Stay current with your immunizations.  Do not use any tobacco products including cigarettes, chewing tobacco, or electronic cigarettes.  If you are pregnant, do not drink alcohol.  If you are breastfeeding, limit how much and how often you drink alcohol.  Limit alcohol intake to no more than 1 drink per day for nonpregnant women. One drink equals 12 ounces of beer, 5 ounces of wine, or 1 ounces of hard liquor.  Do not use street  drugs.  Do not share needles.  Ask your health care provider for help if you need support or information about quitting drugs.  Tell your health care provider if you often feel depressed.  Tell your health care provider if you have ever been abused or do not feel safe at home. This information is not intended to replace advice given to you by your health care provider. Make sure you discuss any questions you have with your health care provider. Document Released: 06/30/2011 Document Revised: 05/22/2016 Document Reviewed: 09/18/2015 Elsevier Interactive Patient Education  2018 Elsevier Inc.  

## 2017-12-31 ENCOUNTER — Other Ambulatory Visit: Payer: Self-pay | Admitting: Physician Assistant

## 2018-01-01 LAB — IRON AND TIBC
Iron Saturation: 23 % (ref 15–55)
Iron: 83 ug/dL (ref 27–159)
Total Iron Binding Capacity: 354 ug/dL (ref 250–450)
UIBC: 271 ug/dL (ref 131–425)

## 2018-01-01 LAB — FERRITIN: Ferritin: 16 ng/mL (ref 15–150)

## 2018-01-01 LAB — LIPID PANEL
Chol/HDL Ratio: 3.8 ratio (ref 0.0–4.4)
Cholesterol, Total: 147 mg/dL (ref 100–199)
HDL: 39 mg/dL — ABNORMAL LOW (ref >39)
LDL Calculated: 74 mg/dL (ref 0–99)
Triglycerides: 171 mg/dL — ABNORMAL HIGH (ref 0–149)
VLDL Cholesterol Cal: 34 mg/dL (ref 5–40)

## 2018-01-01 LAB — COMP. METABOLIC PANEL (12)
AST: 19 IU/L (ref 0–40)
Albumin/Globulin Ratio: 1.4 (ref 1.2–2.2)
Albumin: 4.5 g/dL (ref 3.5–5.5)
Alkaline Phosphatase: 66 IU/L (ref 39–117)
BUN/Creatinine Ratio: 18 (ref 9–23)
BUN: 11 mg/dL (ref 6–20)
Bilirubin Total: 0.5 mg/dL (ref 0.0–1.2)
Calcium: 9.6 mg/dL (ref 8.7–10.2)
Chloride: 100 mmol/L (ref 96–106)
Creatinine, Ser: 0.6 mg/dL (ref 0.57–1.00)
GFR calc Af Amer: 139 mL/min/{1.73_m2} (ref >59)
GFR calc non Af Amer: 121 mL/min/{1.73_m2} (ref >59)
Globulin, Total: 3.2 g/dL (ref 1.5–4.5)
Glucose: 84 mg/dL (ref 65–99)
Potassium: 4.4 mmol/L (ref 3.5–5.2)
Sodium: 140 mmol/L (ref 134–144)
Total Protein: 7.7 g/dL (ref 6.0–8.5)
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
## 2018-01-07 ENCOUNTER — Other Ambulatory Visit: Payer: Self-pay | Admitting: Physician Assistant

## 2018-01-07 DIAGNOSIS — Z131 Encounter for screening for diabetes mellitus: Secondary | ICD-10-CM

## 2018-01-07 DIAGNOSIS — Z13220 Encounter for screening for lipoid disorders: Secondary | ICD-10-CM

## 2018-01-07 DIAGNOSIS — D649 Anemia, unspecified: Secondary | ICD-10-CM

## 2018-01-18 ENCOUNTER — Telehealth: Payer: Self-pay

## 2018-01-18 NOTE — Telephone Encounter (Signed)
Tried calling; number is not in service.   Thanks,   -Vernona RiegerLaura

## 2018-01-18 NOTE — Telephone Encounter (Signed)
-----   Message from Trey SailorsAdriana M Pollak, [place] sent at 01/18/2018  1:30 PM EST ----- No signs of diabetes. Kidney and liver function normal. Potassium has normalized as well. Cholesterol well controlled. No iron deficiency.

## 2018-01-21 NOTE — Telephone Encounter (Signed)
Patient advised as below.  

## 2018-01-22 LAB — CBC WITH DIFFERENTIAL/PLATELET
Basophils Absolute: 0 10*3/uL (ref 0.0–0.2)
Basos: 0 %
EOS (ABSOLUTE): 0.1 10*3/uL (ref 0.0–0.4)
Eos: 1 %
Hematocrit: 35.4 % (ref 34.0–46.6)
Hemoglobin: 11.9 g/dL (ref 11.1–15.9)
Immature Grans (Abs): 0 10*3/uL (ref 0.0–0.1)
Immature Granulocytes: 0 %
Lymphocytes Absolute: 2.2 10*3/uL (ref 0.7–3.1)
Lymphs: 44 %
MCH: 28.5 pg (ref 26.6–33.0)
MCHC: 33.6 g/dL (ref 31.5–35.7)
MCV: 85 fL (ref 79–97)
Monocytes Absolute: 0.4 10*3/uL (ref 0.1–0.9)
Monocytes: 8 %
Neutrophils Absolute: 2.4 10*3/uL (ref 1.4–7.0)
Neutrophils: 47 %
Platelets: 272 10*3/uL (ref 150–379)
RBC: 4.18 x10E6/uL (ref 3.77–5.28)
RDW: 16.6 % — ABNORMAL HIGH (ref 12.3–15.4)
WBC: 5.1 10*3/uL (ref 3.4–10.8)

## 2018-01-22 LAB — COMPREHENSIVE METABOLIC PANEL
ALT: 19 IU/L (ref 0–32)
AST: 19 IU/L (ref 0–40)
Albumin/Globulin Ratio: 1.5 (ref 1.2–2.2)
Albumin: 4.6 g/dL (ref 3.5–5.5)
Alkaline Phosphatase: 66 IU/L (ref 39–117)
BUN/Creatinine Ratio: 15 (ref 9–23)
BUN: 10 mg/dL (ref 6–20)
Bilirubin Total: 0.3 mg/dL (ref 0.0–1.2)
CO2: 22 mmol/L (ref 20–29)
Calcium: 9.2 mg/dL (ref 8.7–10.2)
Chloride: 101 mmol/L (ref 96–106)
Creatinine, Ser: 0.66 mg/dL (ref 0.57–1.00)
GFR calc Af Amer: 135 mL/min/{1.73_m2} (ref >59)
GFR calc non Af Amer: 117 mL/min/{1.73_m2} (ref >59)
Globulin, Total: 3 g/dL (ref 1.5–4.5)
Glucose: 86 mg/dL (ref 65–99)
Potassium: 4.5 mmol/L (ref 3.5–5.2)
Sodium: 141 mmol/L (ref 134–144)
Total Protein: 7.6 g/dL (ref 6.0–8.5)

## 2018-01-22 LAB — IRON,TIBC AND FERRITIN PANEL
Ferritin: 12 ng/mL — ABNORMAL LOW (ref 15–150)
Iron Saturation: 13 % — ABNORMAL LOW (ref 15–55)
Iron: 49 ug/dL (ref 27–159)
Total Iron Binding Capacity: 371 ug/dL (ref 250–450)
UIBC: 322 ug/dL (ref 131–425)

## 2018-01-22 LAB — LIPID PANEL
Chol/HDL Ratio: 3.7 ratio (ref 0.0–4.4)
Cholesterol, Total: 147 mg/dL (ref 100–199)
HDL: 40 mg/dL (ref >39)
LDL Calculated: 82 mg/dL (ref 0–99)
Triglycerides: 123 mg/dL (ref 0–149)
VLDL Cholesterol Cal: 25 mg/dL (ref 5–40)

## 2018-02-05 ENCOUNTER — Encounter: Payer: Self-pay | Admitting: Physician Assistant

## 2018-02-08 ENCOUNTER — Telehealth: Payer: Self-pay

## 2018-02-08 NOTE — Telephone Encounter (Signed)
LMTCB 02/08/2018  Thanks,   -Arien Morine  

## 2018-02-08 NOTE — Telephone Encounter (Signed)
-----   Message from Trey SailorsAdriana M Pollak, [place] sent at 02/05/2018 11:16 AM EST ----- She is not anemic, but her iron stores are a little low. Would recommend taking ferrous sulfate 325 mg twice a day and rechecking this in the future. Other labs normal.

## 2018-02-15 NOTE — Telephone Encounter (Signed)
Can we please check on this psychiatry referral? Looks like this patient has not been contacted. Please let me know if I should re-refer.

## 2018-02-15 NOTE — Telephone Encounter (Signed)
Mother was advised of lab report she states that her daughter recently started taking multivitamin and will continue with that. Mom also wanted to inquiry about referral to psychiatry that was made on 12/23/17, she states no one had ever got back with her with an appointment date or time and she states her daughter needs to speak with someone and is concerned about her mental health. KW

## 2018-02-15 NOTE — Telephone Encounter (Signed)
LMTCB-KW 

## 2018-02-18 NOTE — Telephone Encounter (Signed)
LMTCB at [facility] °

## 2018-02-23 NOTE — Telephone Encounter (Signed)
[facility]

## 2018-02-25 ENCOUNTER — Encounter: Payer: Self-pay | Admitting: Physician Assistant

## 2018-02-26 NOTE — Telephone Encounter (Signed)
[facility] is not taking Medicaid at present time.[facility]

## 2018-03-17 ENCOUNTER — Telehealth: Payer: Self-pay | Admitting: Physician Assistant

## 2018-03-17 NOTE — Telephone Encounter (Signed)
[facility] called stating their office has made attempts to contact pt without a return phone call.I left message at [phone] for pt or her mother to return call to discuss

## 2018-04-07 ENCOUNTER — Telehealth: Payer: Self-pay | Admitting: Physician Assistant

## 2018-04-07 NOTE — Telephone Encounter (Signed)
[facility] 1[facility]

## 2018-04-22 DIAGNOSIS — E538 Deficiency of other specified B group vitamins: Secondary | ICD-10-CM | POA: Insufficient documentation

## 2018-04-27 ENCOUNTER — Ambulatory Visit (INDEPENDENT_AMBULATORY_CARE_PROVIDER_SITE_OTHER): Payer: Medicaid Other | Admitting: Physician Assistant

## 2018-04-27 ENCOUNTER — Encounter: Payer: Self-pay | Admitting: Physician Assistant

## 2018-04-27 VITALS — BP 106/62 | HR 60 | Temp 98.3°F | Resp 16 | Wt 154.0 lb

## 2018-04-27 DIAGNOSIS — L299 Pruritus, unspecified: Secondary | ICD-10-CM | POA: Diagnosis not present

## 2018-04-27 DIAGNOSIS — W57XXXA Bitten or stung by nonvenomous insect and other nonvenomous arthropods, initial encounter: Secondary | ICD-10-CM

## 2018-04-27 DIAGNOSIS — S069X9S Unspecified intracranial injury with loss of consciousness of unspecified duration, sequela: Secondary | ICD-10-CM

## 2018-04-27 DIAGNOSIS — H04123 Dry eye syndrome of bilateral lacrimal glands: Secondary | ICD-10-CM

## 2018-04-27 DIAGNOSIS — Z9109 Other allergy status, other than to drugs and biological substances: Secondary | ICD-10-CM | POA: Diagnosis not present

## 2018-04-27 MED ORDER — CETIRIZINE HCL 10 MG PO TABS: 10.0000 mg | ORAL_TABLET | Freq: Every day | ORAL | 1 refills | Status: DC | PRN

## 2018-04-27 MED ORDER — TRIAMCINOLONE ACETONIDE 0.1 % EX CREA: 1.0000 "application " | TOPICAL_CREAM | Freq: Two times a day (BID) | CUTANEOUS | 0 refills | Status: DC

## 2018-04-27 NOTE — Patient Instructions (Signed)

## 2018-04-27 NOTE — Progress Notes (Signed)
Patient: Holly Middleton Female    DOB: 1986/12/28   31 y.o.   MRN: [id number] Visit Date: 04/28/2018  Today's Provider: Trey Sailors, PA-C   Chief Complaint  Patient presents with  . Tick Removal    About five days ago.   Subjective:    HPI  Patient reports she had a tick bite 5 days ago. She thinks the tick may have been on her for two days. It was on her left upper back. She says it was small and yellowish looking. She pulled the tick off and has not experienced rash, fever, joint pain.   She is also needing a handicapped decal signed for her car. Her and her mother are attempting to look for services in the community to pay for mobility aids etc. They would appreciate referral to [facility] team.  In the interim, she has not been contacted by psychiatry. Her and her mother plan on going to [facility] for group counseling. Not interested in medications.      Allergies  Allergen Reactions  . Adhesive [Tape] Rash    Adhesives from bandaies Paper tape is ok to use.  . Lamotrigine Rash and Other (See Comments)    Rash, dark urine, yellow eyes.     Current Outpatient Medications:  .  albuterol (PROVENTIL HFA;VENTOLIN HFA) 108 (90 Base) MCG/ACT inhaler, Inhale 2 puffs into the lungs 3 (three) times daily., Disp: , Rfl:  .  cetirizine (ZYRTEC) 10 MG tablet, Take 1 tablet (10 mg total) by mouth daily as needed., Disp: 90 tablet, Rfl: 1 .  divalproex (DEPAKOTE) 250 MG DR tablet, Take 2 tablets (500 mg total) by mouth 2 (two) times daily., Disp: 60 tablet, Rfl: 0 .  fluticasone (FLONASE) 50 MCG/ACT nasal spray, Place 2 sprays into both nostrils daily as needed. , Disp: , Rfl:  .  levETIRAcetam (KEPPRA) 750 MG tablet, Take 1 tablet (750 mg total) by mouth 2 (two) times daily., Disp: 60 tablet, Rfl: 0 .  levothyroxine (SYNTHROID, LEVOTHROID) 175 MCG tablet, Take 175 mcg by mouth daily before breakfast., Disp: , Rfl:  .  Olopatadine HCl (PATADAY) 0.2 % SOLN, Apply 1 drop to eye every  morning. both eyes, Disp: , Rfl:  .  sertraline (ZOLOFT) 100 MG tablet, Take 250 mg by mouth at bedtime. , Disp: , Rfl:  .  topiramate (TOPAMAX) 50 MG tablet, Take 50 mg by mouth at bedtime., Disp: , Rfl:  .  triamcinolone cream (KENALOG) 0.1 %, Apply 1 application topically 2 (two) times daily., Disp: 30 g, Rfl: 0  Review of Systems  Constitutional: Negative.   Musculoskeletal: Negative.   Skin: Positive for rash. Negative for color change, pallor and wound.  Neurological: Negative for dizziness, light-headedness and headaches.    Social History   Tobacco Use  . Smoking status: Former Smoker    Last attempt to quit: 04/29/2016    Years since quitting: 1.9  . Smokeless tobacco: Never Used  Substance Use Topics  . Alcohol use: Yes    Frequency: Never    Comment: Very rarely   Objective:   BP 106/62 (BP Location: Right Arm, Patient Position: Sitting, Cuff Size: Normal)   Pulse 60   Temp 98.3 F (36.8 C) (Oral)   Resp 16   Wt 154 lb (69.9 kg)   BMI 27.28 kg/m  Vitals:   04/27/18 1126  BP: 106/62  Pulse: 60  Resp: 16  Temp: 98.3 F (36.8 C)  TempSrc:  Oral  Weight: 154 lb (69.9 kg)     Physical Exam  Constitutional: She is oriented to person, place, and time. She appears well-developed and well-nourished.  Cardiovascular: Normal rate and regular rhythm.  Pulmonary/Chest: Effort normal and breath sounds normal.  Neurological: She is alert and oriented to person, place, and time.  Skin: Skin is warm and dry.           Assessment & Plan:     1. Tick bite, initial encounter  Unable to positively confirm as deer tick and >2 days after bite, do not think prophylaxis is indicated. Patient and mother do not want to start abx, would like itching treatment. Counseled on return precautions including rash, fever, joint pain.   - triamcinolone cream (KENALOG) 0.1 %; Apply 1 application topically 2 (two) times daily.  Dispense: 30 g; Refill: 0  2. Environmental  allergies  - cetirizine (ZYRTEC) 10 MG tablet; Take 1 tablet (10 mg total) by mouth daily as needed.  Dispense: 90 tablet; Refill: 1  3. Traumatic brain injury with loss of consciousness, sequela (HCC)  - Ambulatory referral to [facility]  4. Dry eyes  - cetirizine (ZYRTEC) 10 MG tablet; Take 1 tablet (10 mg total) by mouth daily as needed.  Dispense: 90 tablet; Refill: 1  5. Itching  - triamcinolone cream (KENALOG) 0.1 %; Apply 1 application topically 2 (two) times daily.  Dispense: 30 g; Refill: 0  Return if symptoms worsen or fail to improve.  The entirety of the information documented in the History of Present Illness, Review of Systems and Physical Exam were personally obtained by me. Portions of this information were initially documented by Kavin Leech, CMA and reviewed by me for thoroughness and accuracy.         Trey Sailors, PA-C  [facility]

## 2018-06-23 ENCOUNTER — Ambulatory Visit: Payer: Medicaid Other | Admitting: Physician Assistant

## 2018-06-23 ENCOUNTER — Encounter: Payer: Self-pay | Admitting: Physician Assistant

## 2018-06-23 VITALS — BP 110/74 | HR 64 | Temp 98.0°F | Resp 16 | Wt 155.0 lb

## 2018-06-23 DIAGNOSIS — F329 Major depressive disorder, single episode, unspecified: Secondary | ICD-10-CM

## 2018-06-23 DIAGNOSIS — F32A Depression, unspecified: Secondary | ICD-10-CM

## 2018-06-23 NOTE — Progress Notes (Signed)
Patient: Holly Middleton Female    DOB: 06-Dec-1986   32 y.o.   MRN: [id number] Visit Date: 06/25/2018  Today's Provider: Trey Sailors, PA-C   Chief Complaint  Patient presents with  . Depression    Follow - up visit   Subjective:    Depression         This is a chronic problem.  Associated symptoms include no decreased concentration, no headaches and no suicidal ideas.  Doing well. Desires no medications. Has not established yet with [facility] counseling but plans to. Going on trip to [place] with family for fourth of July and is excited about this. Started training with mobility aid under instruction of association for blind and visually impaired group as she has been instructed her eyesight will get worse. Had good visit with neurologist and will follow up with them in one year. Got handicapped placard with no issue.     Allergies  Allergen Reactions  . Adhesive [Tape] Rash    Adhesives from bandaies Paper tape is ok to use.  . Lamotrigine Rash and Other (See Comments)    Rash, dark urine, yellow eyes.     Current Outpatient Medications:  .  albuterol (PROVENTIL HFA;VENTOLIN HFA) 108 (90 Base) MCG/ACT inhaler, Inhale 2 puffs into the lungs 3 (three) times daily., Disp: , Rfl:  .  cetirizine (ZYRTEC) 10 MG tablet, Take 1 tablet (10 mg total) by mouth daily as needed., Disp: 90 tablet, Rfl: 1 .  divalproex (DEPAKOTE) 250 MG DR tablet, Take 2 tablets (500 mg total) by mouth 2 (two) times daily., Disp: 60 tablet, Rfl: 0 .  fluticasone (FLONASE) 50 MCG/ACT nasal spray, Place 2 sprays into both nostrils daily as needed. , Disp: , Rfl:  .  levETIRAcetam (KEPPRA) 750 MG tablet, Take 1 tablet (750 mg total) by mouth 2 (two) times daily., Disp: 60 tablet, Rfl: 0 .  levothyroxine (SYNTHROID, LEVOTHROID) 175 MCG tablet, Take 175 mcg by mouth daily before breakfast., Disp: , Rfl:  .  Olopatadine HCl (PATADAY) 0.2 % SOLN, Apply 1 drop to eye every morning. both eyes, Disp: , Rfl:  .   sertraline (ZOLOFT) 100 MG tablet, Take 250 mg by mouth at bedtime. , Disp: , Rfl:  .  topiramate (TOPAMAX) 50 MG tablet, Take 50 mg by mouth at bedtime., Disp: , Rfl:  .  triamcinolone cream (KENALOG) 0.1 %, Apply 1 application topically 2 (two) times daily., Disp: 30 g, Rfl: 0  Review of Systems  Constitutional: Negative.   Respiratory: Negative.   Cardiovascular: Negative.   Gastrointestinal: Negative.   Neurological: Negative for dizziness, light-headedness and headaches.  Psychiatric/Behavioral: Positive for depression. Negative for agitation, behavioral problems, confusion, decreased concentration, dysphoric mood, hallucinations, self-injury, sleep disturbance and suicidal ideas. The patient is not nervous/anxious and is not hyperactive.     Social History   Tobacco Use  . Smoking status: Former Smoker    Last attempt to quit: 04/29/2016    Years since quitting: 2.1  . Smokeless tobacco: Never Used  Substance Use Topics  . Alcohol use: Yes    Frequency: Never    Comment: Very rarely   Objective:   BP 110/74 (BP Location: Right Arm, Patient Position: Sitting, Cuff Size: Normal)   Pulse 64   Temp 98 F (36.7 C) (Oral)   Resp 16   Wt 155 lb (70.3 kg)   BMI 27.46 kg/m  Vitals:   06/23/18 1549  BP: 110/74  Pulse:  64  Resp: 16  Temp: 98 F (36.7 C)  TempSrc: Oral  Weight: 155 lb (70.3 kg)     Physical Exam  Constitutional: She is oriented to person, place, and time. She appears well-developed and well-nourished.  Cardiovascular: Normal rate and regular rhythm.  Pulmonary/Chest: Effort normal and breath sounds normal.  Neurological: She is alert and oriented to person, place, and time.  Skin: Skin is warm.  Psychiatric: She has a normal mood and affect. Her behavior is normal.        Assessment & Plan:     1. Depression, unspecified depression type  Stable, will establish for [facility] for counseling. F/u 6 mo.  Return in about 6 months (around 12/23/2018) for  depression.  The entirety of the information documented in the History of Present Illness, Review of Systems and Physical Exam were personally obtained by me. Portions of this information were initially documented by Kavin LeechLaura Walsh, CMA and reviewed by me for thoroughness and accuracy.          Trey SailorsAdriana M Mamadou Breon, PA-C  [facility]

## 2018-06-25 DIAGNOSIS — F329 Major depressive disorder, single episode, unspecified: Secondary | ICD-10-CM | POA: Insufficient documentation

## 2018-06-25 DIAGNOSIS — F32A Depression, unspecified: Secondary | ICD-10-CM | POA: Insufficient documentation

## 2018-08-10 ENCOUNTER — Encounter: Payer: Self-pay | Admitting: Family Medicine

## 2018-08-10 ENCOUNTER — Ambulatory Visit: Payer: Medicaid Other | Admitting: Family Medicine

## 2018-08-10 VITALS — BP 110/72 | HR 71 | Temp 97.8°F | Resp 16 | Wt 159.6 lb

## 2018-08-10 DIAGNOSIS — J069 Acute upper respiratory infection, unspecified: Secondary | ICD-10-CM | POA: Diagnosis not present

## 2018-08-10 NOTE — Patient Instructions (Signed)
Discussed use of Mucinex or Robitussin expectorant. May try Delsym for cough. Claritin for sinus drip. Let me know if sinuses not improving after you get through the week.

## 2018-08-10 NOTE — Progress Notes (Signed)
  Subjective:     Patient ID: Holly Middleton, female   DOB: June 02, 1986, 32 y.o.   MRN: [id number] Chief Complaint  Patient presents with  . Sore Throat    Patient comes in office today with concerns of sore throat and post nasal drainage for the past 3 days. Patient reports that she has had slight cough and sneezing in the PM.    HPI States children in her family have been sick as well. Accompanied by her mother today.  Review of Systems     Objective:   Physical Exam  Constitutional: She appears well-developed and well-nourished. No distress.  Ears: T.M's intact without inflammation Throat: no tonsillar enlargement or exudate Neck: bilateral tender anterior cervical nodees Lungs: clear     Assessment:    1. Viral upper respiratory tract infection    Plan    Discussed use of expectorants, Delsym, and Claritin.Further f/u if sinuses not improving over the course of the week.

## 2018-08-18 DIAGNOSIS — E890 Postprocedural hypothyroidism: Secondary | ICD-10-CM | POA: Diagnosis not present

## 2018-08-26 DIAGNOSIS — E890 Postprocedural hypothyroidism: Secondary | ICD-10-CM | POA: Diagnosis not present

## 2018-09-11 ENCOUNTER — Telehealth: Payer: Self-pay | Admitting: Physician Assistant

## 2018-09-11 NOTE — Telephone Encounter (Signed)
I spoke with the patient's mom and gave her information for [employer] Program [place](SHIIP) [phone] option 1.  They will be able to answer any questions about Medicare.  I was also going to [facility] to [facility], but she stated that she is already participating in activities with them.

## 2018-10-21 ENCOUNTER — Ambulatory Visit: Payer: Medicaid Other

## 2018-10-23 ENCOUNTER — Ambulatory Visit (INDEPENDENT_AMBULATORY_CARE_PROVIDER_SITE_OTHER): Payer: Medicaid Other

## 2018-10-23 DIAGNOSIS — Z23 Encounter for immunization: Secondary | ICD-10-CM

## 2018-11-29 DIAGNOSIS — E890 Postprocedural hypothyroidism: Secondary | ICD-10-CM | POA: Diagnosis not present

## 2019-01-06 ENCOUNTER — Encounter: Payer: Self-pay | Admitting: Physician Assistant

## 2019-01-06 ENCOUNTER — Ambulatory Visit (INDEPENDENT_AMBULATORY_CARE_PROVIDER_SITE_OTHER): Payer: Medicaid Other | Admitting: Physician Assistant

## 2019-01-06 VITALS — BP 108/76 | HR 66 | Temp 98.2°F | Resp 16 | Wt 157.6 lb

## 2019-01-06 DIAGNOSIS — H5711 Ocular pain, right eye: Secondary | ICD-10-CM

## 2019-01-06 DIAGNOSIS — H02843 Edema of right eye, unspecified eyelid: Secondary | ICD-10-CM

## 2019-01-06 MED ORDER — OFLOXACIN 0.3 % OP SOLN: 1.0000 [drp] | Freq: Four times a day (QID) | OPHTHALMIC | 0 refills | Status: DC

## 2019-01-06 NOTE — Progress Notes (Signed)
Patient: Holly Middleton Female    DOB: 02-Nov-1986   33 y.o.   MRN: [id number] Visit Date: 01/13/2019  Today's Provider: Trey Sailors, PA-C   Chief Complaint  Patient presents with  . Eye Problem   Subjective:     HPI Patient here today c/o lesion under her right eye brow x's 3 days. Patient reports that today her eye was swollen, had discharge. Patient reports she has been using worm compresses. Reports her right eye has improved in terms of the discharge. She denies right eye pain. She has loss of peripheral vision at base line and she does not feel she has any further change in her vision.  Allergies  Allergen Reactions  . Adhesive [Tape] Rash    Adhesives from bandaies Paper tape is ok to use.  . Lamotrigine Rash and Other (See Comments)    Rash, dark urine, yellow eyes.     Current Outpatient Medications:  .  albuterol (PROVENTIL HFA;VENTOLIN HFA) 108 (90 Base) MCG/ACT inhaler, Inhale 2 puffs into the lungs 3 (three) times daily., Disp: , Rfl:  .  divalproex (DEPAKOTE) 500 MG DR tablet, Take by mouth., Disp: , Rfl:  .  fluticasone (FLONASE) 50 MCG/ACT nasal spray, Place 2 sprays into both nostrils daily as needed. , Disp: , Rfl:  .  levETIRAcetam (KEPPRA) 750 MG tablet, Take 1 tablet (750 mg total) by mouth 2 (two) times daily., Disp: 60 tablet, Rfl: 0 .  levothyroxine (SYNTHROID, LEVOTHROID) 125 MCG tablet, Take by mouth., Disp: , Rfl:  .  ofloxacin (OCUFLOX) 0.3 % ophthalmic solution, Place 1 drop into the right eye 4 (four) times daily., Disp: 5 mL, Rfl: 0  Review of Systems  Eyes: Positive for pain and discharge. Negative for visual disturbance.    Social History   Tobacco Use  . Smoking status: Former Smoker    Last attempt to quit: 04/29/2016    Years since quitting: 2.7  . Smokeless tobacco: Never Used  Substance Use Topics  . Alcohol use: Yes    Frequency: Never    Comment: Very rarely      Objective:   BP 108/76 (BP Location: Left Arm,  Patient Position: Sitting, Cuff Size: Normal)   Pulse 66   Temp 98.2 F (36.8 C) (Oral)   Resp 16   Wt 157 lb 9.6 oz (71.5 kg)   BMI 27.92 kg/m  Vitals:   01/06/19 1542  BP: 108/76  Pulse: 66  Resp: 16  Temp: 98.2 F (36.8 C)  TempSrc: Oral  Weight: 157 lb 9.6 oz (71.5 kg)     Physical Exam Constitutional:      Appearance: Normal appearance.  HENT:     Right Ear: Tympanic membrane and ear canal normal.     Left Ear: Tympanic membrane and ear canal normal.     Mouth/Throat:     Mouth: Mucous membranes are moist.     Pharynx: Oropharynx is clear.  Eyes:     General:        Right eye: No discharge.        Left eye: No discharge.     Conjunctiva/sclera: Conjunctivae normal.     Pupils: Pupils are equal, round, and reactive to light.   Skin:    General: Skin is warm and dry.  Neurological:     Mental Status: She is alert and oriented to person, place, and time. Mental status is at baseline.  Psychiatric:  Mood and Affect: Mood normal.        Behavior: Behavior normal.         Assessment & Plan    1. Acute right eye pain  Looks like viral conjunctivitis. Will give drops as below in case she develops injected eye, constant discharge more in line with bacterial conjunctivitis. If there is severe worsening, she should be [facility].   - ofloxacin (OCUFLOX) 0.3 % ophthalmic solution; Place 1 drop into the right eye 4 (four) times daily.  Dispense: 5 mL; Refill: 0  2. Swelling of gland of right eyelid  Slightly swollen though per patient report it has improved.   - ofloxacin (OCUFLOX) 0.3 % ophthalmic solution; Place 1 drop into the right eye 4 (four) times daily.  Dispense: 5 mL; Refill: 0  The entirety of the information documented in the History of Present Illness, Review of Systems and Physical Exam were personally obtained by me. Portions of this information were initially documented by Rondel BatonSulibeya Dimas, CMA and reviewed by  me for thoroughness and accuracy.   Return if symptoms worsen or fail to improve.     Trey SailorsAdriana M Kierra Jezewski, PA-C  [facility]

## 2019-01-06 NOTE — Patient Instructions (Signed)
Chalazion  A chalazion is a swelling or lump on the eyelid. It can affect the upper eyelid or the lower eyelid. What are the causes? This condition may be caused by:  Long-lasting (chronic) inflammation of the eyelid glands.  A blocked oil gland in the eyelid. What are the signs or symptoms? Symptoms of this condition include:  Swelling of the eyelid. The swelling may spread to areas around the eye.  A hard lump on the eyelid.  Blurry vision. The lump on the eyelid may make it hard to see out of the eye. How is this diagnosed? This condition is diagnosed with an examination of the eye. How is this treated? This condition is treated by applying a warm compress to the eyelid. If the condition does not improve, it may be treated with:  Medicine that is injected into the chalazion by a health care provider.  Surgery.  Medicine that is applied to the eye. Follow these instructions at home: Managing pain and swelling  Apply a warm, moist compress to the eyelid 4-6 times a day for 10-15 minutes at a time. This will help to open any blocked glands and to reduce redness and swelling.  Apply over-the-counter and prescription medicines only as told by your health care provider. General instructions  Do not touch the chalazion.  Do not try to remove the pus. Do not squeeze the chalazion or stick it with a pin or needle.  Do not rub your eyes.  Wash your hands often. Dry your hands with a clean towel.  Keep your face, scalp, and eyebrows clean.  Avoid wearing eye makeup.  If the chalazion does not break open (rupture) on its own, return to your health care provider.  Keep all follow-up appointments as told by your health care provider. This is important. Contact a health care provider if:  Your eyelid has not improved in 4 weeks.  Your eyelid is getting worse.  You have a fever.  The chalazion does not rupture on its own after a month of home treatment. Get help right  away if:  You have pain in your eye.  Your vision changes.  The chalazion becomes painful or red.  The chalazion gets bigger. Summary  A chalazion is a swelling or lump on the upper or lower eyelid.  It may be caused by chronic inflammation or a blocked oil gland.  Apply a warm, moist compress to the eyelid 4-6 times a day for 10-15 minutes at a time.  Keep your face, scalp, and eyebrows clean. This information is not intended to replace advice given to you by your health care provider. Make sure you discuss any questions you have with your health care provider. Document Released: 12/12/2000 Document Revised: 06/03/2018 Document Reviewed: 06/03/2018 Elsevier Interactive Patient Education  2019 Elsevier Inc.  

## 2019-02-16 ENCOUNTER — Ambulatory Visit (INDEPENDENT_AMBULATORY_CARE_PROVIDER_SITE_OTHER): Payer: Medicaid Other | Admitting: Physician Assistant

## 2019-02-16 ENCOUNTER — Encounter: Payer: Self-pay | Admitting: Physician Assistant

## 2019-02-16 VITALS — BP 105/67 | HR 58 | Temp 98.3°F | Resp 16 | Wt 161.0 lb

## 2019-02-16 DIAGNOSIS — E039 Hypothyroidism, unspecified: Secondary | ICD-10-CM

## 2019-02-16 DIAGNOSIS — R05 Cough: Secondary | ICD-10-CM

## 2019-02-16 DIAGNOSIS — R059 Cough, unspecified: Secondary | ICD-10-CM

## 2019-02-16 MED ORDER — PROMETHAZINE-DM 6.25-15 MG/5ML PO SYRP: 5.0000 mL | ORAL_SOLUTION | Freq: Every evening | ORAL | 0 refills | Status: DC | PRN

## 2019-02-16 NOTE — Patient Instructions (Signed)

## 2019-02-16 NOTE — Progress Notes (Signed)
Patient: Holly Middleton Female    DOB: [birth year]   33 y.o.   MRN: [id number] Visit Date: 02/17/2019  Today's Provider: Trey Sailors, PA-C   Chief Complaint  Patient presents with  . Cough   Subjective:     HPI Upper Respiratory Infection: Patient complains of symptoms of a URI. Symptoms include cough. Onset of symptoms was 4 days ago, unchanged since that time. She also c/o cough described as nonproductive, harsh and nocturnal for the past 4 days .  She is drinking plenty of fluids. Evaluation to date: none. Treatment to date: none.  Patient with history of multnodular goiter and hypothyroidism followed by Dr. Tedd Sias at [facility]ocrinology. Recently had visit with her but were waiting over an hour to be seen, had to leave for court appointment. Last OV 08/26/2018, TSH was overcorrected and was reduced to levothyroxine 125 mcg. She reports taking this daily on an empty stomach with only water and she waits one hour before taking seizure medications. TSH 11/29/2018 was 0.408.   Allergies  Allergen Reactions  . Adhesive [Tape] Rash    Adhesives from bandaies Paper tape is ok to use.  . Lamotrigine Rash and Other (See Comments)    Rash, dark urine, yellow eyes.  . Other Rash    Adhesives from bandaies Paper tape is ok to use.     Current Outpatient Medications:  .  albuterol (PROVENTIL HFA;VENTOLIN HFA) 108 (90 Base) MCG/ACT inhaler, Inhale 2 puffs into the lungs 3 (three) times daily., Disp: , Rfl:  .  divalproex (DEPAKOTE) 500 MG DR tablet, Take by mouth., Disp: , Rfl:  .  fluticasone (FLONASE) 50 MCG/ACT nasal spray, Place 2 sprays into both nostrils daily as needed. , Disp: , Rfl:  .  levETIRAcetam (KEPPRA) 750 MG tablet, Take 1 tablet (750 mg total) by mouth 2 (two) times daily., Disp: 60 tablet, Rfl: 0 .  levothyroxine (SYNTHROID, LEVOTHROID) 125 MCG tablet, Take by mouth., Disp: , Rfl:  .  promethazine-dextromethorphan (PROMETHAZINE-DM) 6.25-15 MG/5ML syrup, Take  5 mLs by mouth at bedtime as needed., Disp: 118 mL, Rfl: 0  Review of Systems  Constitutional: Negative.   HENT: Positive for congestion.   Respiratory: Positive for cough.   Cardiovascular: Negative.     Social History   Tobacco Use  . Smoking status: Former Smoker    Last attempt to quit: 04/29/2016    Years since quitting: 2.8  . Smokeless tobacco: Never Used  Substance Use Topics  . Alcohol use: Yes    Frequency: Never    Comment: Very rarely      Objective:   BP 105/67 (BP Location: Left Arm, Patient Position: Sitting, Cuff Size: Normal)   Pulse (!) 58   Temp 98.3 F (36.8 C) (Oral)   Resp 16   Wt 161 lb (73 kg)   SpO2 99%   BMI 28.52 kg/m  Vitals:   02/16/19 1624  BP: 105/67  Pulse: (!) 58  Resp: 16  Temp: 98.3 F (36.8 C)  TempSrc: Oral  SpO2: 99%  Weight: 161 lb (73 kg)     Physical Exam Constitutional:      Appearance: Normal appearance.  HENT:     Right Ear: Tympanic membrane and ear canal normal.     Left Ear: Tympanic membrane and ear canal normal.  Cardiovascular:     Rate and Rhythm: Normal rate and regular rhythm.     Heart sounds: Normal heart sounds.  Pulmonary:  Effort: Pulmonary effort is normal.     Breath sounds: Normal breath sounds.  Neurological:     Mental Status: She is alert and oriented to person, place, and time. Mental status is at baseline.  Psychiatric:        Mood and Affect: Mood normal.        Behavior: Behavior normal.         Assessment & Plan    1. Hypothyroidism, unspecified type  - TSH  2. Cough  - promethazine-dextromethorphan (PROMETHAZINE-DM) 6.25-15 MG/5ML syrup; Take 5 mLs by mouth at bedtime as needed.  Dispense: 118 mL; Refill: 0  The entirety of the information documented in the History of Present Illness, Review of Systems and Physical Exam were personally obtained by me. Portions of this information were initially documented by Rondel Baton, CMA and reviewed by me for thoroughness and  accuracy.   Return if symptoms worsen or fail to improve.     Trey Sailors, PA-C  [facility]

## 2019-02-24 DIAGNOSIS — E039 Hypothyroidism, unspecified: Secondary | ICD-10-CM | POA: Diagnosis not present

## 2019-02-25 ENCOUNTER — Telehealth: Payer: Self-pay

## 2019-02-25 LAB — TSH: TSH: 0.26 u[IU]/mL — ABNORMAL LOW (ref 0.450–4.500)

## 2019-02-25 NOTE — Telephone Encounter (Signed)
Patient advised as below.  

## 2019-02-25 NOTE — Telephone Encounter (Signed)
lmtcb

## 2019-02-25 NOTE — Telephone Encounter (Signed)
-----   Message from Trey Sailors, [place] sent at 02/25/2019  3:13 PM EST ----- Her dose is slightly more over corrected than last time. This might mean she needs a reduction in her thyroid medication. Is she going back to see Dr. Tedd Sias?

## 2019-03-29 ENCOUNTER — Ambulatory Visit (INDEPENDENT_AMBULATORY_CARE_PROVIDER_SITE_OTHER): Payer: Medicaid Other | Admitting: Physician Assistant

## 2019-03-29 ENCOUNTER — Encounter: Payer: Self-pay | Admitting: Physician Assistant

## 2019-03-29 ENCOUNTER — Other Ambulatory Visit: Payer: Self-pay | Admitting: *Deleted

## 2019-03-29 DIAGNOSIS — R569 Unspecified convulsions: Secondary | ICD-10-CM | POA: Diagnosis not present

## 2019-03-29 MED ORDER — FLUTICASONE PROPIONATE 50 MCG/ACT NA SUSP
2.0000 | Freq: Every day | NASAL | 1 refills | Status: DC | PRN
Start: 2019-03-29 — End: 2020-04-04

## 2019-03-29 NOTE — Patient Instructions (Signed)

## 2019-03-29 NOTE — Telephone Encounter (Signed)
Patient is requesting a refill for Flonase. [facility] [address]

## 2019-03-29 NOTE — Progress Notes (Signed)
       Patient: Holly Middleton Female    DOB: 1986/11/17   33 y.o.   MRN: [id number] Visit Date: 03/29/2019  Today's Provider: Trey Sailors, PA-C   Chief Complaint  Patient presents with  . Virtual Visit   Subjective:     HPI   This visit was completed virtually due to restrictions on office visits 2/2 COVID-19. Mother concerned about Holly Middleton due to COVID-19. 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 Holly Middleton has not traveled in the past months, she has no sick contacts. She has been abiding by the stay at home orders. She denies fevers, chills, sore throat, cough, SOB, confusion, lethargy, nausea, vomiting and diarrhea.   Allergies  Allergen Reactions  . Adhesive [Tape] Rash    Adhesives from bandaies Paper tape is ok to use.  . Lamotrigine Rash and Other (See Comments)    Rash, dark urine, yellow eyes.  . Other Rash    Adhesives from bandaies Paper tape is ok to use.     Current Outpatient Medications:  .  albuterol (PROVENTIL HFA;VENTOLIN HFA) 108 (90 Base) MCG/ACT inhaler, Inhale 2 puffs into the lungs 3 (three) times daily., Disp: , Rfl:  .  fluticasone (FLONASE) 50 MCG/ACT nasal spray, Place 2 sprays into both nostrils daily as needed., Disp: 16 g, Rfl: 1 .  levETIRAcetam (KEPPRA) 750 MG tablet, Take 1 tablet (750 mg total) by mouth 2 (two) times daily., Disp: 60 tablet, Rfl: 0 .  levothyroxine (SYNTHROID, LEVOTHROID) 125 MCG tablet, Take by mouth., Disp: , Rfl:  .  divalproex (DEPAKOTE) 500 MG DR tablet, Take by mouth., Disp: , Rfl:  .  promethazine-dextromethorphan (PROMETHAZINE-DM) 6.25-15 MG/5ML syrup, Take 5 mLs by mouth at bedtime as needed. (Patient not taking: Reported on 03/29/2019), Disp: 118 mL, Rfl: 0  Review of Systems  Social History   Tobacco Use  . Smoking status: Former Smoker    Last attempt to quit: 04/29/2016    Years since quitting: 2.9  . Smokeless tobacco: Never Used  Substance Use Topics   . Alcohol use: Yes    Frequency: Never    Comment: Very rarely      Objective:   There were no vitals taken for this visit. There were no vitals filed for this visit.   Physical Exam Pulmonary:     Effort: Pulmonary effort is normal. No respiratory distress.  Neurological:     Mental Status: She is alert. Mental status is at baseline.  Psychiatric:        Mood and Affect: Mood normal.        Behavior: Behavior normal.         Assessment & Plan    1. Seizures (HCC)  By all accounts, Holly Middleton sounds very well today on exam. She is talking in complete sentences and has no obvious respiratory distress. Patient and mother both deny fever and travel history. Encouraged patient to continue to follow stay at home orders and minimize contact with others outside the household.   The entirety of the information documented in the History of Present Illness, Review of Systems and Physical Exam were personally obtained by me. Portions of this information were initially documented by Hetty Ely, CMA and reviewed by me for thoroughness and accuracy.   F/u PRN       Trey Sailors, PA-C  [facility]

## 2019-03-29 NOTE — Telephone Encounter (Signed)
Please Review

## 2019-05-19 ENCOUNTER — Ambulatory Visit (INDEPENDENT_AMBULATORY_CARE_PROVIDER_SITE_OTHER): Payer: Medicaid Other | Admitting: Obstetrics and Gynecology

## 2019-05-19 ENCOUNTER — Encounter: Payer: Self-pay | Admitting: Obstetrics and Gynecology

## 2019-05-19 ENCOUNTER — Other Ambulatory Visit: Payer: Self-pay

## 2019-05-19 VITALS — Ht 62.0 in | Wt 150.0 lb

## 2019-05-19 DIAGNOSIS — N939 Abnormal uterine and vaginal bleeding, unspecified: Secondary | ICD-10-CM | POA: Diagnosis not present

## 2019-05-19 DIAGNOSIS — Z7189 Other specified counseling: Secondary | ICD-10-CM | POA: Diagnosis not present

## 2019-05-19 DIAGNOSIS — N946 Dysmenorrhea, unspecified: Secondary | ICD-10-CM
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
## 2019-05-24 DIAGNOSIS — Z79899 Other long term (current) drug therapy: Secondary | ICD-10-CM | POA: Insufficient documentation

## 2019-05-24 DIAGNOSIS — R569 Unspecified convulsions: Secondary | ICD-10-CM | POA: Insufficient documentation

## 2019-05-24 DIAGNOSIS — R51 Headache: Secondary | ICD-10-CM | POA: Diagnosis not present

## 2019-05-30 DIAGNOSIS — E890 Postprocedural hypothyroidism: Secondary | ICD-10-CM | POA: Diagnosis not present

## 2019-06-03 ENCOUNTER — Telehealth: Payer: Self-pay | Admitting: Obstetrics and Gynecology

## 2019-06-03 NOTE — Telephone Encounter (Signed)
Lmtrc

## 2019-06-03 NOTE — Telephone Encounter (Signed)
Patient and her mother, Malachi Bonds (on [place]), returned the call and are aware of H&P at [facility] on 06/29/19 @ 2:10pm, Pre-admit Testing and COVID testing to be scheduled, and OR on 07/21/19. Patient will be asked to quarantine after COVID testing. Patient may receive calls from the [facility] and [facility]. Medicaid confirmed.

## 2019-06-03 NOTE — Telephone Encounter (Signed)
-----   Message from Vena Austria, MD sent at 05/19/2019  8:46 PM EDT ----- Regarding: surgery Surgery Date:   LOS: surgery admit  Surgery Booking Request Patient Full Name: Holly Middleton MRN: [id number]  DOB: 03/31/1986  Surgeon: Vena Austria, MD  Requested Surgery Date and Time: ? Primary Diagnosis and Code: dysmenorrhea Secondary Diagnosis and Code: menorrhagia Surgical Procedure: TLH/BS and cystoscopy L&D Notification:N/A Admission Status: observation Length of Surgery: 2hrs Special Case Needs: none H&P: 2-3 weeks prior to obtain pap, endometrial biopsy, and labs (date) Phone Interview or Office Pre-Admit: pre-admit Interpreter: No Language: English Medical Clearance: No Special Scheduling Instructions: none

## 2019-06-09 ENCOUNTER — Telehealth: Payer: Self-pay

## 2019-06-09 ENCOUNTER — Telehealth: Payer: Self-pay | Admitting: Obstetrics and Gynecology
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
## 2019-06-09 NOTE — Telephone Encounter (Signed)
Not that I am aware of, we may need to get clearance from her ENT to see if this is ok to do

## 2019-06-09 NOTE — Telephone Encounter (Signed)
Patient's mother Peter Congo will make an appointment w/ an ENT and will let me know of appointment details so a clearance request can be faxed.
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
## 2019-06-09 NOTE — Telephone Encounter (Signed)
The OBGYNs clinic or surgery scheduler should set up a pre-op visit to get COVID testing. They will get in touch closer to the surgery date. She does not need a specialist to perform the test, there is not a risk of injuring her brain with the test.

## 2019-06-10 NOTE — Telephone Encounter (Signed)
Patient's mom called back to check on this message. Please call mom back asap.

## 2019-06-10 NOTE — Telephone Encounter (Signed)
Patient's mother Peter Congo called to say she spoke w/ Charlena Cross, Dr Stacie Glaze nurse, who said they could not give a clearance as it has been too long since the patient has been seen, but there shouldn't be any problem w/ the patient having the COVID test.

## 2019-06-13 NOTE — Telephone Encounter (Signed)
LVMTRC 

## 2019-06-14 NOTE — Telephone Encounter (Signed)
Patient advised as below.  

## 2019-06-29 ENCOUNTER — Ambulatory Visit (INDEPENDENT_AMBULATORY_CARE_PROVIDER_SITE_OTHER): Payer: Medicaid Other | Admitting: Obstetrics and Gynecology

## 2019-06-29 ENCOUNTER — Other Ambulatory Visit (HOSPITAL_COMMUNITY)
Admission: RE | Admit: 2019-06-29 | Discharge: 2019-06-29 | Disposition: A | Discharge status: Home / Self Care | Payer: Medicaid Other | Source: Ambulatory Visit | Attending: Obstetrics and Gynecology | Admitting: Obstetrics and Gynecology

## 2019-06-29 ENCOUNTER — Other Ambulatory Visit: Payer: Self-pay

## 2019-06-29 ENCOUNTER — Encounter: Payer: Self-pay | Admitting: Obstetrics and Gynecology

## 2019-06-29 VITALS — BP 90/60 | Ht 63.0 in | Wt 149.0 lb

## 2019-06-29 DIAGNOSIS — N946 Dysmenorrhea, unspecified: Secondary | ICD-10-CM | POA: Diagnosis not present

## 2019-06-29 DIAGNOSIS — N920 Excessive and frequent menstruation with regular cycle: Secondary | ICD-10-CM | POA: Diagnosis not present

## 2019-06-29 DIAGNOSIS — Z01818 Encounter for other preprocedural examination: Secondary | ICD-10-CM | POA: Insufficient documentation

## 2019-06-29 DIAGNOSIS — Z124 Encounter for screening for malignant neoplasm of cervix: Secondary | ICD-10-CM | POA: Diagnosis not present

## 2019-06-29 DIAGNOSIS — N939 Abnormal uterine and vaginal bleeding, unspecified: Secondary | ICD-10-CM | POA: Diagnosis not present
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
## 2019-07-06 LAB — CYTOLOGY - PAP
Diagnosis: UNDETERMINED — AB
HPV 16/18/45 genotyping: POSITIVE — AB
HPV: DETECTED — AB

## 2019-07-11 ENCOUNTER — Telehealth: Payer: Self-pay | Admitting: Obstetrics and Gynecology

## 2019-07-11 NOTE — Telephone Encounter (Signed)
Patient had an abnormal preoperative pap, she'll need a colposcopy prior to proceeding with hysterectomy schedule 07/21/2019. I don't know if I have any time in my schedule if we could do it Wednesday at 8:30 on my day off that would work and allow results to come back in time for the 23rd.   Left msg for patient to call back and schedule appointment.

## 2019-07-11 NOTE — Telephone Encounter (Signed)
[phone]. Is not a working number.

## 2019-07-11 NOTE — Telephone Encounter (Signed)
Called and left voice mail for patient to call back to be schedule °

## 2019-07-11 NOTE — Telephone Encounter (Signed)
-----   Message from Alexandria Lodge sent at 07/11/2019 12:57 PM EDT ----- Regarding: FW: Colposcopy Clarise Cruz, Please call Virgina Jock, patient's mother, [phone] to schedule a colpo for this Wed, 7/15 @ 8:30am.  ----- Message ----- From: Malachy Mood, MD Sent: 07/11/2019  12:44 PM EDT To: Lorraine Lax, Danie Chandler Subject: Colposcopy                                     Patient had an abnormal preoperative pap, she'll need a colposcopy prior to proceeding with hysterectomy schedule 07/21/2019.  I don't know if I have any time in my schedule if we could do it Wednesday at 8:30 on my day off that would work and allow results to come back in time for the 23rd.  I have not been able to reach the patient by phone.   Malachy Mood, MD, [facility], [facility] 07/11/2019, 12:46 PM

## 2019-07-12 NOTE — Telephone Encounter (Signed)
Patient aware of date, location and time °

## 2019-07-13 ENCOUNTER — Other Ambulatory Visit (INDEPENDENT_AMBULATORY_CARE_PROVIDER_SITE_OTHER): Payer: Medicaid Other | Admitting: Obstetrics and Gynecology

## 2019-07-13 ENCOUNTER — Other Ambulatory Visit (HOSPITAL_COMMUNITY)
Admission: RE | Admit: 2019-07-13 | Discharge: 2019-07-13 | Disposition: A | Discharge status: Home / Self Care | Payer: Medicaid Other | Source: Ambulatory Visit | Attending: Obstetrics and Gynecology | Admitting: Obstetrics and Gynecology

## 2019-07-13 ENCOUNTER — Ambulatory Visit: Payer: Medicaid Other

## 2019-07-13 ENCOUNTER — Other Ambulatory Visit: Payer: Self-pay

## 2019-07-13 DIAGNOSIS — R87810 Cervical high risk human papillomavirus (HPV) DNA test positive: Secondary | ICD-10-CM | POA: Diagnosis present

## 2019-07-13 DIAGNOSIS — N870 Mild cervical dysplasia: Secondary | ICD-10-CM

## 2019-07-13 DIAGNOSIS — R87610 Atypical squamous cells of undetermined significance on cytologic smear of cervix (ASC-US): Secondary | ICD-10-CM | POA: Insufficient documentation

## 2019-07-13 DIAGNOSIS — N879 Dysplasia of cervix uteri, unspecified: Secondary | ICD-10-CM | POA: Diagnosis not present

## 2019-07-13 NOTE — Progress Notes (Signed)
   GYNECOLOGY CLINIC COLPOSCOPY PROCEDURE NOTE  33 y.o. [id number] here for colposcopy for ASCUS with POSITIVE high risk HPV  pap smear on 06/29/2019. Discussed underlying role for HPV infection in the development of cervical dysplasia, its natural history and progression/regression, need for surveillance.  Is the patient  pregnant: No LMP: No LMP recorded. (Menstrual status: Other). Smoking status:  reports that she quit smoking about 3 years ago. She has never used smokeless tobacco. Contraception: tubal ligation Future fertility desired:  No  Patient given informed consent, signed copy in the chart, time out was performed.  The patient was position in dorsal lithotomy position. Speculum was placed the cervix was visualized.   After application of acetic acid colposcopic inspection of the cervix was undertaken.   Colposcopy adequate, full visualization of transformation zone: Yes aceto0white visible lesion at 5 O'Clock; corresponding biopsies obtained.   ECC specimen obtained:  Yes  All specimens were labeled and sent to pathology.   Patient was given post procedure instructions.  Will follow up pathology and manage accordingly.  Routine preventative health maintenance measures emphasized.  OBGyn Exam  Malachy Mood, MD, [facility], [facility]

## 2019-07-18 ENCOUNTER — Encounter
Admission: RE | Admit: 2019-07-18 | Discharge: 2019-07-18 | Disposition: A | Discharge status: Home / Self Care | Payer: Medicaid Other | Source: Ambulatory Visit | Attending: Obstetrics and Gynecology | Admitting: Obstetrics and Gynecology

## 2019-07-18 ENCOUNTER — Other Ambulatory Visit: Payer: Self-pay

## 2019-07-18 DIAGNOSIS — Z01812 Encounter for preprocedural laboratory examination: Secondary | ICD-10-CM | POA: Insufficient documentation

## 2019-07-18 DIAGNOSIS — Z1159 Encounter for screening for other viral diseases: Secondary | ICD-10-CM | POA: Insufficient documentation

## 2019-07-18 LAB — PREGNANCY, URINE: Preg Test, Ur: NEGATIVE

## 2019-07-18 NOTE — Patient Instructions (Signed)
Your procedure is scheduled on: Thursday 07/21/19  Report to [facility]. To find out your arrival time please call [phone] between 1PM - 3PM on Wednesday 02/19/19   Remember: Instructions that are not followed completely may result in serious medical risk, up to and including death, or upon the discretion of your surgeon and anesthesiologist your surgery may need to be rescheduled.      _X__ 1. Do not eat food after midnight the night before your procedure.                 No gum chewing or hard candies. You may drink clear liquids up to 2 hours                 before you are scheduled to arrive for your surgery- DO NOT drink clear                 liquids within 2 hours of the start of your surgery.                 Clear Liquids include:  water, apple juice without pulp, clear carbohydrate                 drink such as Clearfast or Gatorade, Black Coffee or Tea (Do not add                 anything to coffee or tea).   Be sure to finish your Pre-Surgery Ensure that Dr. Georgianne Fick ordered 2 hours before your arrival time.   __X__2.  On the morning of surgery brush your teeth with toothpaste and water, you may rinse your mouth with mouthwash if you wish.  Do not swallow any toothpaste or mouthwash.      _X__ 3.  No Alcohol for 24 hours before or after surgery.    _X__ 4.  Do Not Smoke or use e-cigarettes For 24 Hours Prior to Your Surgery.                 Do not use any chewable tobacco products for at least 6 hours prior to                 surgery.   __X__5.  Notify your doctor if there is any change in your medical condition      (cold, fever, infections).     Do not wear jewelry, make-up, hairpins, clips or nail polish. Do not wear lotions, powders, or perfumes.  Do not shave 48 hours prior to surgery. Men may shave face and neck. Do not bring valuables to the hospital.    [facility] is not responsible for any  belongings or valuables.  Contacts, dentures/partials or body piercings may not be worn into surgery. Bring a case for your contacts, glasses or hearing aids, a denture cup will be supplied.  Leave your suitcase in the car. After surgery it may be brought to your room. For patients admitted to the hospital, discharge time is determined by your treatment team.   Patients discharged the day of surgery will not be allowed to drive home.   Please read over the following fact sheets that you were given:   MRSA Information  __X__ Take these medicines the morning of surgery with A SIP OF WATER:     1. albuterol (PROVENTIL HFA;VENTOLIN HFA) 108 (90 Base) MCG/ACT inhaler  2. divalproex (DEPAKOTE) 500 MG DR tablet  3. levETIRAcetam (  KEPPRA) 750 MG tablet  4. levothyroxine (SYNTHROID) 112 MCG tablet  5. olopatadine (PATADAY) 0.1 % ophthalmic solution  6. loratadine (CLARITIN) 10 MG tablet (If needed)  7. fluticasone (FLONASE) 50 MCG/ACT nasal spray (If needed)       __X__ Use the CHG soap the night before and the morning of your procedure.   __X__ Use inhalers on the day of surgery. Also bring the inhaler with you to the hospital on the morning of surgery.   __X__ Stop Anti-inflammatories 7 days before surgery such as Advil, Ibuprofen, Motrin, BC or Goodies Powder, Naprosyn, Naproxen, Aleve, Aspirin, Meloxicam. May take Tylenol if needed for pain or discomfort.    __X__ Stop all herbal supplements, fish oil or vitamin E until after surgery.

## 2019-07-19 LAB — SARS CORONAVIRUS 2 (TAT 6-24 HRS): SARS Coronavirus 2: NEGATIVE

## 2019-07-20 MED ORDER — CEFAZOLIN SODIUM-DEXTROSE 2-4 GM/100ML-% IV SOLN
2.0000 g | INTRAVENOUS | Status: AC
  Administered 2019-07-21: 2 g via INTRAVENOUS

## 2019-07-21 ENCOUNTER — Other Ambulatory Visit: Payer: Self-pay

## 2019-07-21 ENCOUNTER — Ambulatory Visit: Payer: Medicaid Other | Admitting: Certified Registered Nurse Anesthetist

## 2019-07-21 ENCOUNTER — Encounter
Admission: RE | Disposition: A | Discharge status: Home / Self Care | Payer: Self-pay | Source: Ambulatory Visit | Attending: Obstetrics and Gynecology

## 2019-07-21 ENCOUNTER — Encounter: Payer: Self-pay | Admitting: Certified Registered Nurse Anesthetist

## 2019-07-21 ENCOUNTER — Observation Stay
Admission: RE | Admit: 2019-07-21 | Discharge: 2019-07-22 | Disposition: A | Discharge status: Home / Self Care | Payer: Medicaid Other | Source: Ambulatory Visit | Attending: Obstetrics and Gynecology | Admitting: Obstetrics and Gynecology

## 2019-07-21 DIAGNOSIS — E039 Hypothyroidism, unspecified: Secondary | ICD-10-CM | POA: Diagnosis not present

## 2019-07-21 DIAGNOSIS — N946 Dysmenorrhea, unspecified: Secondary | ICD-10-CM | POA: Insufficient documentation

## 2019-07-21 DIAGNOSIS — Z90710 Acquired absence of both cervix and uterus: Secondary | ICD-10-CM | POA: Diagnosis present

## 2019-07-21 DIAGNOSIS — J45909 Unspecified asthma, uncomplicated: Secondary | ICD-10-CM | POA: Diagnosis not present

## 2019-07-21 DIAGNOSIS — Z79899 Other long term (current) drug therapy: Secondary | ICD-10-CM | POA: Insufficient documentation

## 2019-07-21 DIAGNOSIS — N870 Mild cervical dysplasia: Secondary | ICD-10-CM | POA: Insufficient documentation

## 2019-07-21 DIAGNOSIS — R569 Unspecified convulsions: Secondary | ICD-10-CM | POA: Insufficient documentation

## 2019-07-21 DIAGNOSIS — M1990 Unspecified osteoarthritis, unspecified site: Secondary | ICD-10-CM | POA: Diagnosis not present

## 2019-07-21 DIAGNOSIS — R87810 Cervical high risk human papillomavirus (HPV) DNA test positive: Secondary | ICD-10-CM | POA: Diagnosis not present

## 2019-07-21 DIAGNOSIS — Z79890 Hormone replacement therapy (postmenopausal): Secondary | ICD-10-CM | POA: Insufficient documentation

## 2019-07-21 DIAGNOSIS — Z87891 Personal history of nicotine dependence: Secondary | ICD-10-CM | POA: Diagnosis not present

## 2019-07-21 DIAGNOSIS — Z888 Allergy status to other drugs, medicaments and biological substances status: Secondary | ICD-10-CM | POA: Diagnosis not present

## 2019-07-21 DIAGNOSIS — Z87820 Personal history of traumatic brain injury: Secondary | ICD-10-CM | POA: Insufficient documentation

## 2019-07-21 DIAGNOSIS — N920 Excessive and frequent menstruation with regular cycle: Secondary | ICD-10-CM | POA: Diagnosis not present

## 2019-07-21 HISTORY — PX: CYSTOSCOPY: SHX5120

## 2019-07-21 HISTORY — DX: Acquired absence of both cervix and uterus: Z90.710

## 2019-07-21 HISTORY — PX: TOTAL LAPAROSCOPIC HYSTERECTOMY WITH SALPINGECTOMY: SHX6742

## 2019-07-21 LAB — POCT PREGNANCY, URINE: Preg Test, Ur: NEGATIVE

## 2019-07-21 LAB — ABO/RH: ABO/RH(D): O POS

## 2019-07-21 SURGERY — HYSTERECTOMY, TOTAL, LAPAROSCOPIC, WITH SALPINGECTOMY
Anesthesia: General

## 2019-07-21 MED ORDER — KETOROLAC TROMETHAMINE 30 MG/ML IJ SOLN
INTRAMUSCULAR | Status: DC | PRN
  Administered 2019-07-21: 30 mg via INTRAVENOUS

## 2019-07-21 MED ORDER — MENTHOL 3 MG MT LOZG
1.0000 | LOZENGE | OROMUCOSAL | Status: DC | PRN
  Filled 2019-07-21: qty 9

## 2019-07-21 MED ORDER — MIDAZOLAM HCL 2 MG/2ML IJ SOLN
INTRAMUSCULAR | Status: DC | PRN
  Administered 2019-07-21: 2 mg via INTRAVENOUS

## 2019-07-21 MED ORDER — PROPOFOL 10 MG/ML IV BOLUS
INTRAVENOUS | Status: AC
  Filled 2019-07-21: qty 20

## 2019-07-21 MED ORDER — MORPHINE SULFATE (PF) 2 MG/ML IV SOLN: 1.0000 mg | INTRAVENOUS | Status: DC | PRN

## 2019-07-21 MED ORDER — ONDANSETRON HCL 4 MG/2ML IJ SOLN: 4.0000 mg | Freq: Four times a day (QID) | INTRAMUSCULAR | Status: DC | PRN

## 2019-07-21 MED ORDER — DEXTROSE-NACL 5-0.45 % IV SOLN
INTRAVENOUS | Status: DC
  Administered 2019-07-21 (×2): via INTRAVENOUS

## 2019-07-21 MED ORDER — FENTANYL CITRATE (PF) 100 MCG/2ML IJ SOLN
25.0000 ug | INTRAMUSCULAR | Status: DC | PRN
  Administered 2019-07-21 (×4): 25 ug via INTRAVENOUS

## 2019-07-21 MED ORDER — LACTATED RINGERS IV SOLN
INTRAVENOUS | Status: DC
  Administered 2019-07-21: 08:00:00 via INTRAVENOUS

## 2019-07-21 MED ORDER — FENTANYL CITRATE (PF) 100 MCG/2ML IJ SOLN
INTRAMUSCULAR | Status: AC
  Filled 2019-07-21: qty 2

## 2019-07-21 MED ORDER — OXYCODONE HCL 5 MG PO TABS: 5.0000 mg | ORAL_TABLET | Freq: Once | ORAL | Status: DC | PRN

## 2019-07-21 MED ORDER — PROMETHAZINE HCL 25 MG/ML IJ SOLN: 6.2500 mg | INTRAMUSCULAR | Status: DC | PRN

## 2019-07-21 MED ORDER — MEPERIDINE HCL 50 MG/ML IJ SOLN: 6.2500 mg | INTRAMUSCULAR | Status: DC | PRN

## 2019-07-21 MED ORDER — ALBUTEROL SULFATE (2.5 MG/3ML) 0.083% IN NEBU: 2.5000 mg | INHALATION_SOLUTION | Freq: Four times a day (QID) | RESPIRATORY_TRACT | Status: DC | PRN

## 2019-07-21 MED ORDER — OXYCODONE-ACETAMINOPHEN 5-325 MG PO TABS: 1.0000 | ORAL_TABLET | ORAL | Status: DC | PRN

## 2019-07-21 MED ORDER — BUPIVACAINE HCL (PF) 0.5 % IJ SOLN
INTRAMUSCULAR | Status: AC
  Filled 2019-07-21: qty 30

## 2019-07-21 MED ORDER — LIDOCAINE HCL (CARDIAC) PF 100 MG/5ML IV SOSY
PREFILLED_SYRINGE | INTRAVENOUS | Status: DC | PRN
  Administered 2019-07-21: 80 mg via INTRAVENOUS

## 2019-07-21 MED ORDER — KETOROLAC TROMETHAMINE 30 MG/ML IJ SOLN
30.0000 mg | Freq: Four times a day (QID) | INTRAMUSCULAR | Status: DC | PRN
  Administered 2019-07-21 – 2019-07-22 (×3): 30 mg via INTRAVENOUS
  Filled 2019-07-21 (×4): qty 1

## 2019-07-21 MED ORDER — FENTANYL CITRATE (PF) 100 MCG/2ML IJ SOLN
INTRAMUSCULAR | Status: AC
  Administered 2019-07-21: 11:00:00 25 ug via INTRAVENOUS
  Filled 2019-07-21: qty 2

## 2019-07-21 MED ORDER — ROCURONIUM BROMIDE 100 MG/10ML IV SOLN
INTRAVENOUS | Status: DC | PRN
  Administered 2019-07-21: 50 mg via INTRAVENOUS

## 2019-07-21 MED ORDER — FLUORESCEIN SODIUM 10 % IV SOLN
INTRAVENOUS | Status: AC
  Filled 2019-07-21: qty 5

## 2019-07-21 MED ORDER — CEFAZOLIN SODIUM-DEXTROSE 2-4 GM/100ML-% IV SOLN
INTRAVENOUS | Status: AC
  Filled 2019-07-21: qty 100

## 2019-07-21 MED ORDER — FENTANYL CITRATE (PF) 100 MCG/2ML IJ SOLN
INTRAMUSCULAR | Status: DC | PRN
  Administered 2019-07-21 (×2): 50 ug via INTRAVENOUS

## 2019-07-21 MED ORDER — PROPOFOL 10 MG/ML IV BOLUS
INTRAVENOUS | Status: DC | PRN
  Administered 2019-07-21: 130 mg via INTRAVENOUS

## 2019-07-21 MED ORDER — ACETAMINOPHEN 10 MG/ML IV SOLN
INTRAVENOUS | Status: DC | PRN
  Administered 2019-07-21: 1000 mg via INTRAVENOUS

## 2019-07-21 MED ORDER — LIDOCAINE HCL (PF) 2 % IJ SOLN
INTRAMUSCULAR | Status: AC
  Filled 2019-07-21: qty 10

## 2019-07-21 MED ORDER — SUGAMMADEX SODIUM 200 MG/2ML IV SOLN
INTRAVENOUS | Status: DC | PRN
  Administered 2019-07-21: 200 mg via INTRAVENOUS

## 2019-07-21 MED ORDER — GLYCOPYRROLATE 0.2 MG/ML IJ SOLN
INTRAMUSCULAR | Status: AC
  Filled 2019-07-21: qty 1

## 2019-07-21 MED ORDER — ONDANSETRON HCL 4 MG/2ML IJ SOLN
INTRAMUSCULAR | Status: DC | PRN
  Administered 2019-07-21: 4 mg via INTRAVENOUS

## 2019-07-21 MED ORDER — ACETAMINOPHEN NICU IV SYRINGE 10 MG/ML
INTRAVENOUS | Status: AC
  Filled 2019-07-21: qty 1

## 2019-07-21 MED ORDER — MIDAZOLAM HCL 2 MG/2ML IJ SOLN
INTRAMUSCULAR | Status: AC
  Filled 2019-07-21: qty 2

## 2019-07-21 MED ORDER — FLUORESCEIN SODIUM 10 % IV SOLN
INTRAVENOUS | Status: DC | PRN
  Administered 2019-07-21: 3 mL via INTRAVENOUS

## 2019-07-21 MED ORDER — FAMOTIDINE 20 MG PO TABS: 20.0000 mg | ORAL_TABLET | Freq: Once | ORAL | Status: DC

## 2019-07-21 MED ORDER — SUCCINYLCHOLINE CHLORIDE 20 MG/ML IJ SOLN
INTRAMUSCULAR | Status: AC
  Filled 2019-07-21: qty 1

## 2019-07-21 MED ORDER — BUPIVACAINE HCL 0.5 % IJ SOLN
INTRAMUSCULAR | Status: DC | PRN
  Administered 2019-07-21: 13 mL

## 2019-07-21 MED ORDER — DIVALPROEX SODIUM 500 MG PO DR TAB
500.0000 mg | DELAYED_RELEASE_TABLET | Freq: Two times a day (BID) | ORAL | Status: DC
  Administered 2019-07-21 – 2019-07-22 (×2): 500 mg via ORAL
  Filled 2019-07-21 (×3): qty 1

## 2019-07-21 MED ORDER — ROCURONIUM BROMIDE 50 MG/5ML IV SOLN
INTRAVENOUS | Status: AC
  Filled 2019-07-21: qty 1

## 2019-07-21 MED ORDER — FAMOTIDINE 20 MG PO TABS
ORAL_TABLET | ORAL | Status: AC
  Administered 2019-07-21: 07:00:00 20 mg
  Filled 2019-07-21: qty 1

## 2019-07-21 MED ORDER — OXYCODONE HCL 5 MG/5ML PO SOLN: 5.0000 mg | Freq: Once | ORAL | Status: DC | PRN

## 2019-07-21 MED ORDER — LEVETIRACETAM 750 MG PO TABS
750.0000 mg | ORAL_TABLET | Freq: Two times a day (BID) | ORAL | Status: DC
  Administered 2019-07-21 – 2019-07-22 (×2): 750 mg via ORAL
  Filled 2019-07-21 (×2): qty 1

## 2019-07-21 MED ORDER — LEVOTHYROXINE SODIUM 112 MCG PO TABS
112.0000 ug | ORAL_TABLET | Freq: Every day | ORAL | Status: DC
  Administered 2019-07-22: 112 ug via ORAL
  Filled 2019-07-21: qty 1

## 2019-07-21 MED ORDER — DEXAMETHASONE SODIUM PHOSPHATE 10 MG/ML IJ SOLN
INTRAMUSCULAR | Status: AC
  Filled 2019-07-21: qty 1

## 2019-07-21 MED ORDER — DEXAMETHASONE SODIUM PHOSPHATE 10 MG/ML IJ SOLN
INTRAMUSCULAR | Status: DC | PRN
  Administered 2019-07-21: 8 mg via INTRAVENOUS

## 2019-07-21 MED ORDER — ONDANSETRON HCL 4 MG/2ML IJ SOLN
INTRAMUSCULAR | Status: AC
  Filled 2019-07-21: qty 2

## 2019-07-21 SURGICAL SUPPLY — 54 items
BAG URINE DRAINAGE (UROLOGICAL SUPPLIES) ×4 IMPLANT
BLADE SURG SZ11 CARB STEEL (BLADE) ×4 IMPLANT
CANISTER SUCT 1200ML W/VALVE (MISCELLANEOUS) ×4 IMPLANT
CATH FOLEY 2WAY  5CC 16FR (CATHETERS) ×2
CATH URTH 16FR FL 2W BLN LF (CATHETERS) ×2 IMPLANT
CHLORAPREP W/TINT 26 (MISCELLANEOUS) ×4 IMPLANT
COVER WAND RF STERILE (DRAPES) ×4 IMPLANT
DEFOGGER SCOPE WARMER CLEARIFY (MISCELLANEOUS) ×4 IMPLANT
DERMABOND ADVANCED (GAUZE/BANDAGES/DRESSINGS) ×2
DERMABOND ADVANCED .7 DNX12 (GAUZE/BANDAGES/DRESSINGS) ×2 IMPLANT
DEVICE SUTURE ENDOST 10MM (ENDOMECHANICALS) ×4 IMPLANT
GAUZE 4X4 16PLY RFD (DISPOSABLE) ×4 IMPLANT
GLOVE BIO SURGEON STRL SZ7 (GLOVE) ×16 IMPLANT
GLOVE INDICATOR 7.5 STRL GRN (GLOVE) ×16 IMPLANT
GOWN STRL REUS W/ TWL LRG LVL3 (GOWN DISPOSABLE) ×4 IMPLANT
GOWN STRL REUS W/ TWL XL LVL3 (GOWN DISPOSABLE) ×2 IMPLANT
GOWN STRL REUS W/TWL LRG LVL3 (GOWN DISPOSABLE) ×4
GOWN STRL REUS W/TWL XL LVL3 (GOWN DISPOSABLE) ×2
GRASPER SUT TROCAR 14GX15 (MISCELLANEOUS) ×4 IMPLANT
IRRIGATION STRYKERFLOW (MISCELLANEOUS) ×2 IMPLANT
IRRIGATOR STRYKERFLOW (MISCELLANEOUS) ×4
IV LACTATED RINGERS 1000ML (IV SOLUTION) ×4 IMPLANT
IV NS 1000ML (IV SOLUTION) ×2
IV NS 1000ML BAXH (IV SOLUTION) ×2 IMPLANT
KIT PINK PAD W/HEAD ARE REST (MISCELLANEOUS) ×4
KIT PINK PAD W/HEAD ARM REST (MISCELLANEOUS) ×2 IMPLANT
LABEL OR SOLS (LABEL) ×4 IMPLANT
MANIPULATOR VCARE LG CRV RETR (MISCELLANEOUS) ×4 IMPLANT
MANIPULATOR VCARE SML CRV RETR (MISCELLANEOUS) IMPLANT
MANIPULATOR VCARE STD CRV RETR (MISCELLANEOUS) IMPLANT
NS IRRIG 1000ML POUR BTL (IV SOLUTION) ×4 IMPLANT
NS IRRIG 500ML POUR BTL (IV SOLUTION) ×4 IMPLANT
OCCLUDER COLPOPNEUMO (BALLOONS) ×4 IMPLANT
PACK GYN LAPAROSCOPIC (MISCELLANEOUS) ×4 IMPLANT
PAD OB MATERNITY 4.3X12.25 (PERSONAL CARE ITEMS) ×4 IMPLANT
PAD PREP 24X41 OB/GYN DISP (PERSONAL CARE ITEMS) ×4 IMPLANT
SCISSORS METZENBAUM CVD 33 (INSTRUMENTS) ×4 IMPLANT
SET CYSTO W/LG BORE CLAMP LF (SET/KITS/TRAYS/PACK) ×4 IMPLANT
SHEARS HARMONIC ACE PLUS 36CM (ENDOMECHANICALS) ×4 IMPLANT
SLEEVE ENDOPATH XCEL 5M (ENDOMECHANICALS) ×4 IMPLANT
STRAP SAFETY 5IN WIDE (MISCELLANEOUS) ×4 IMPLANT
SURGILUBE 2OZ TUBE FLIPTOP (MISCELLANEOUS) ×4 IMPLANT
SUT ENDO VLOC 180-0-8IN (SUTURE) ×4 IMPLANT
SUT MNCRL 4-0 (SUTURE) ×4
SUT MNCRL 4-0 27XMFL (SUTURE) ×4
SUT VIC AB 0 CT1 27 (SUTURE) ×2
SUT VIC AB 0 CT1 27XCR 8 STRN (SUTURE) ×2 IMPLANT
SUT VIC AB 0 CT1 36 (SUTURE) ×4 IMPLANT
SUTURE MNCRL 4-0 27XMF (SUTURE) ×4 IMPLANT
SYR 10ML LL (SYRINGE) ×4 IMPLANT
SYR 50ML LL SCALE MARK (SYRINGE) ×4 IMPLANT
TROCAR ENDO BLADELESS 11MM (ENDOMECHANICALS) ×4 IMPLANT
TROCAR XCEL NON-BLD 5MMX100MML (ENDOMECHANICALS) ×4 IMPLANT
TUBING EVAC SMOKE HEATED PNEUM (TUBING) ×4 IMPLANT
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
## 2019-07-21 NOTE — Transfer of Care (Signed)
Immediate Anesthesia Transfer of Care Note  Patient: Rickard Rhymes  Procedure(s) Performed: TOTAL LAPAROSCOPIC HYSTERECTOMY WITH SALPINGECTOMY (Bilateral ) CYSTOSCOPY (N/A )  Patient Location: PACU  Anesthesia Type:General  Level of Consciousness: drowsy  Airway & Oxygen Therapy: Patient Spontanous Breathing and Patient connected to face mask oxygen  Post-op Assessment: Report given to RN and Post -op Vital signs reviewed and stable  Post vital signs: Reviewed and stable  Last Vitals:  Vitals Value Taken Time  BP 98/54 07/21/19 1040  Temp 36.4 C 07/21/19 1040  Pulse 67 07/21/19 1040  Resp 20 07/21/19 1040  SpO2 100 % 07/21/19 1040    Last Pain:  Vitals:   07/21/19 1040  TempSrc: Temporal  PainSc:          Complications: No apparent anesthesia complications

## 2019-07-21 NOTE — Anesthesia Post-op Follow-up Note (Signed)
Anesthesia QCDR form completed.        

## 2019-07-21 NOTE — Addendum Note (Signed)
Addendum  created 07/21/19 1251 by Johnna Acosta, CRNA   Charge Capture section accepted

## 2019-07-21 NOTE — Op Note (Signed)
Preoperative Diagnosis: 1) 33 y.o. with menorrhagia and dysmenorrhea  Postoperative Diagnosis: 1) 33 y.o. with menorrhagia and dysmenorrhea  Operation Performed: Total laparoscopic hysterectomy, bilateral salpingectomy, and cystoscopy  Indication: Menorrhagia, dysmenorrhea  Surgeon: Malachy Mood, MD  Assistant: Barnett Applebaum, MD this surgery required a high level surgical assistant with none other readily available  Anesthesia: General  Preoperative Antibiotics: none  Estimated Blood Loss: 100 mL  IV Fluids: 871mL  Drains or Tubes: none  Implants: none  Specimens Removed: Uterus, cervix, and bilateral fallopian tubes  Complications: none  Intraoperative Findings: Normal tubes other than evidence of prior tubal ligation, normal ovaries, normal uterus.  Some evidence of small endometriotic implants right pelvic sidewall.  Cystoscopy revealed intact bladder dome, bilateral efllux of urine from both ureteral orifices.   Patient Condition: stable  Procedure in Detail:  Patient was taken to the operating room where she was administered general anesthesia.  She was positioned in the dorsal lithotomy position utilizing Allen stirups, prepped and draped in the usual sterile fashion.  Prior to proceeding with procedure a time out was performed.  Attention was turned to the patient's pelvis.  An indwelling foley catheter was placed to decompress the patient's bladder.  An operative speculum was placed to allow visualization of the cervix.  The anterior lip of the cervix was grasped with a single tooth tenaculum, and a large V-care uterine manipulator was placed to allow manipulation of the uterus.  The operative speculum and single tooth tenaculum were then removed.  Attention was turned to the patient's abdomen.  The umbilicus was infiltrated with 1% Sensorcaine, before making a stab incision using an 11 blade scalpel.  A 62mm Excel trocar was then used to gain direct entry into the  peritoneal cavity utilizing the camera to visualize progress of the trocar during placement.  Once peritoneal entry had been achieved, insufflation was started and pneumoperitoneum established at a pressure of 64mmHg.    One left and one right lower quadrant site were then injected with 1% Sensorcaine and a stab incision was made using an 11 blade scalpel.  Two additional 22mm Excel trocars were placed through these incisions under direct visualization, the umbilical site was stepped up to an 81mm Excel trocar. General inspection of the abdomen revealed the above noted findings.   The right tube was identified and grasped at its fimbriated end.  The tube was transected from its attachments to the ovary and mesosalpinx using a 60mm Harmonic scalpel.  The utero ovarian ligament was identified ligated and transected using the Harmonic scalpel. The round ligament was then likewise ligated and transected.  The anterior leaf of the broad ligament was dissected down to the level of the internal cervical os and a bladder flap was started.  The posterior leaf of the broad ligament was dissected down to the utero-sacral ligament.  The uterine artery was skeletonized before being ligated and transected using the Harmonic scalpel with cephelad pressure applied to the V-care device to assure lateralization of the ureter.  A bite was then taken with Harmonic medial to transected portio of uterine artery to further lateralize the ureter and vessel off the V-care cup.  The patient left adnexal structures were then dissected in similar fashion.  The bladder flap was completed and the bladder mobilized off the V-care cup.  An anterior colpotomy was scores and carried around in a clockwise fashion to free the specimen, which was then removed vaginally.  Inspection revealed all pedicles to be hemostatic before proceed  with vaginal closure.  The cuff was closed using Endostitch device with V-loc load in a running fashion right to left.   The cuff was hemsotatic at the conclusion of closure without visible or palpable defects. The 11mm Trocar was closed using a [employer] device and 0 Vicryl suture.  Pneumoperitoneum was evacuated and trocars were removed.  All trocar sites were then dressed with surgical skin glue.    The indwelling foley catheter was removed.  Cystoscopy was performed noting and intact bladder dome as well as brisk efflux of urine from bother ureteral orifices.  The cystoscopy was removed and the indwelling foley catheter was replaced.Marland Kitchen.  Sponge needle and instrument counts were correct time two. The patient tolerated the procedure well and was taken to the recovery room in stable condition.

## 2019-07-21 NOTE — Anesthesia Procedure Notes (Signed)
Procedure Name: Intubation Date/Time: 07/21/2019 9:01 AM Performed by: Johnna Acosta, CRNA Pre-anesthesia Checklist: Patient identified, Emergency Drugs available, Suction available, Patient being monitored and Timeout performed Patient Re-evaluated:Patient Re-evaluated prior to induction Oxygen Delivery Method: Circle system utilized Preoxygenation: Pre-oxygenation with 100% oxygen Induction Type: IV induction Ventilation: Mask ventilation without difficulty Laryngoscope Size: Miller and 2 Grade View: Grade I Tube type: Oral Tube size: 7.0 mm Number of attempts: 1 Airway Equipment and Method: Stylet and Oral airway Placement Confirmation: ETT inserted through vocal cords under direct vision,  positive ETCO2 and breath sounds checked- equal and bilateral Secured at: 20 cm Tube secured with: Tape Dental Injury: Teeth and Oropharynx as per pre-operative assessment

## 2019-07-21 NOTE — OR Nursing (Signed)
Lab tech in for blood draw 0740

## 2019-07-21 NOTE — Anesthesia Postprocedure Evaluation (Signed)
Anesthesia Post Note  Patient: Rickard Rhymes  Procedure(s) Performed: TOTAL LAPAROSCOPIC HYSTERECTOMY WITH SALPINGECTOMY (Bilateral ) CYSTOSCOPY (N/A )  Patient location during evaluation: PACU Anesthesia Type: General Level of consciousness: awake and alert and oriented Pain management: pain level controlled Vital Signs Assessment: post-procedure vital signs reviewed and stable Respiratory status: spontaneous breathing, nonlabored ventilation and respiratory function stable Cardiovascular status: blood pressure returned to baseline and stable Postop Assessment: no signs of nausea or vomiting Anesthetic complications: no     Last Vitals:  Vitals:   07/21/19 1155 07/21/19 1159  BP: (!) 94/54 96/61  Pulse: (!) 54 60  Resp: 17 16  Temp:  37.1 C  SpO2: 100% 100%    Last Pain:  Vitals:   07/21/19 1159  TempSrc:   PainSc: 4                  Morse Brueggemann

## 2019-07-21 NOTE — H&P (Signed)
Date of Initial H&P: 07/13/2019  History reviewed, patient examined, no change in status, stable for surgery.

## 2019-07-22 DIAGNOSIS — N920 Excessive and frequent menstruation with regular cycle: Secondary | ICD-10-CM | POA: Diagnosis not present

## 2019-07-22 LAB — TYPE AND SCREEN
ABO/RH(D): O POS
Antibody Screen: POSITIVE
Unit division: 0
Unit division: 0

## 2019-07-22 LAB — BPAM RBC
Blood Product Expiration Date: 202008012359
Blood Product Expiration Date: 202008082359
Unit Type and Rh: 5100
Unit Type and Rh: 5100

## 2019-07-22 LAB — BASIC METABOLIC PANEL
Anion gap: 7 (ref 5–15)
BUN: 8 mg/dL (ref 6–20)
CO2: 25 mmol/L (ref 22–32)
Calcium: 8.6 mg/dL — ABNORMAL LOW (ref 8.9–10.3)
Chloride: 105 mmol/L (ref 98–111)
Creatinine, Ser: 0.55 mg/dL (ref 0.44–1.00)
GFR calc Af Amer: >60 mL/min (ref >60)
GFR calc non Af Amer: >60 mL/min (ref >60)
Glucose, Bld: 95 mg/dL (ref 70–99)
Potassium: 4.2 mmol/L (ref 3.5–5.1)
Sodium: 137 mmol/L (ref 135–145)

## 2019-07-22 LAB — CBC
HCT: 34.1 % — ABNORMAL LOW (ref 36.0–46.0)
Hemoglobin: 11.1 g/dL — ABNORMAL LOW (ref 12.0–15.0)
MCH: 28.2 pg (ref 26.0–34.0)
MCHC: 32.6 g/dL (ref 30.0–36.0)
MCV: 86.5 fL (ref 80.0–100.0)
Platelets: 223 10*3/uL (ref 150–400)
RBC: 3.94 MIL/uL (ref 3.87–5.11)
RDW: 14 % (ref 11.5–15.5)
WBC: 9.7 10*3/uL (ref 4.0–10.5)
nRBC: 0 % (ref 0.0–0.2)

## 2019-07-22 MED ORDER — OXYCODONE-ACETAMINOPHEN 5-325 MG PO TABS: 1.0000 | ORAL_TABLET | ORAL | 0 refills | Status: DC | PRN

## 2019-07-22 NOTE — Progress Notes (Signed)
Pt discharged home.  Discharge instructions, prescriptions and follow up appointment given to and reviewed with pt and Mother.  Pt and Mother verbalized understanding.  Escorted by Staff.

## 2019-07-22 NOTE — Discharge Summary (Signed)
Physician Discharge Summary  Patient ID: Holly Middleton MRN: [id number] DOB/AGE: [birth year] 33 y.o.  Admit date: 07/21/2019 Discharge date: 07/22/2019  Admission Diagnoses: Menorrhagia and dysmenorrhea  Discharge Diagnoses:  Active Problems:   S/P laparoscopic hysterectomy   S/P hysterectomy   Discharged Condition: good  Hospital Course: Patient was admitted for scheduled TLH, BS, cystoscopy for menorrhagia and dysmenorrhea.  Surgery was uncomplicated.  She did well postoperatively.  At the time of discharge she was meeting all postoperative goals including ambulating, voiding, tolerating a general diet, and remained afebrile/hemodynamically stable throughout admission.  Postoperative H&H and BUN/Cr were stable.    Consults: None  Significant Diagnostic Studies:  Results for orders placed or performed during the hospital encounter of 07/21/19 (from the past 24 hour(s))  CBC     Status: Abnormal   Collection Time: 07/22/19  5:00 AM  Result Value Ref Range   WBC 9.7 4.0 - 10.5 K/uL   RBC 3.94 3.87 - 5.11 MIL/uL   Hemoglobin 11.1 (L) 12.0 - 15.0 g/dL   HCT 40.934.1 (L) 81.136.0 - 91.446.0 %   MCV 86.5 80.0 - 100.0 fL   MCH 28.2 26.0 - 34.0 pg   MCHC 32.6 30.0 - 36.0 g/dL   RDW 78.214.0 95.611.5 - 21.315.5 %   Platelets 223 150 - 400 K/uL   nRBC 0.0 0.0 - 0.2 %  Basic metabolic panel     Status: Abnormal   Collection Time: 07/22/19  5:00 AM  Result Value Ref Range   Sodium 137 135 - 145 mmol/L   Potassium 4.2 3.5 - 5.1 mmol/L   Chloride 105 98 - 111 mmol/L   CO2 25 22 - 32 mmol/L   Glucose, Bld 95 70 - 99 mg/dL   BUN 8 6 - 20 mg/dL   Creatinine, Ser 0.860.55 0.44 - 1.00 mg/dL   Calcium 8.6 (L) 8.9 - 10.3 mg/dL   GFR calc non Af Amer >60 >60 mL/min   GFR calc Af Amer >60 >60 mL/min   Anion gap 7 5 - 15    Treatments: TLH, BS, cystoscopy  Discharge Exam: Blood pressure 99/61, pulse 79, temperature 98.1 F (36.7 C), temperature source Oral, resp. rate 18, height 5\' 3"  (1.6 m), weight 68 kg, last  menstrual period 07/07/2019, SpO2 99 %. General appearance: alert, appears stated age and no distress Resp: no increased work of breathing GI: soft, non-tender, non-distended Incision/Wound:trocar sites D/C/I  Disposition: Discharge disposition: 01-Home or Self Care       Discharge Instructions    Call MD for:   Complete by: As directed    Heavy vaginal bleeding greater than 1 pad an hour   Call MD for:  difficulty breathing, headache or visual disturbances   Complete by: As directed    Call MD for:  extreme fatigue   Complete by: As directed    Call MD for:  hives   Complete by: As directed    Call MD for:  persistant dizziness or light-headedness   Complete by: As directed    Call MD for:  persistant nausea and vomiting   Complete by: As directed    Call MD for:  redness, tenderness, or signs of infection (pain, swelling, redness, odor or green/yellow discharge around incision site)   Complete by: As directed    Call MD for:  severe uncontrolled pain   Complete by: As directed    Call MD for:  temperature >100.4   Complete by: As directed    Diet  general   Complete by: As directed    Discharge wound care:   Complete by: As directed    You may apply a light dressing for minor discharge from the incision or to keep waistbands of clothing from rubbing.  You may also have been discharge with a clear dressing in which case this will be removed at your postoperative clinic visit.  You may shower, use soap on your incision.  Avoid baths or soaking the incision in the first 6 weeks following your surgery..   Driving restriction   Complete by: As directed    Avoid driving for at least 2 weeks or while taking prescription narcotics.   Lifting restrictions   Complete by: As directed    Weight restriction of 10lbs for 6 weeks.   Sexual acrtivity   Complete by: As directed    No intercourse, tampons, or anything vaginally for 6 weeks     Allergies as of 07/22/2019      Reactions    Adhesive [tape] Rash   Adhesives from bandaies Paper tape is ok to use.   Lamotrigine Rash, Other (See Comments)   Rash, dark urine, yellow eyes.      Medication List    TAKE these medications   albuterol 108 (90 Base) MCG/ACT inhaler Commonly known as: VENTOLIN HFA Inhale 2 puffs into the lungs every 6 (six) hours as needed for wheezing or shortness of breath.   divalproex 500 MG DR tablet Commonly known as: DEPAKOTE Take 500 mg by mouth 2 (two) times daily.   fluticasone 50 MCG/ACT nasal spray Commonly known as: FLONASE Place 2 sprays into both nostrils daily as needed.   levETIRAcetam 750 MG tablet Commonly known as: Keppra Take 1 tablet (750 mg total) by mouth 2 (two) times daily.   levothyroxine 112 MCG tablet Commonly known as: SYNTHROID Take 112 mcg by mouth daily before breakfast.   loratadine 10 MG tablet Commonly known as: CLARITIN Take 10 mg by mouth daily as needed for allergies.   oxyCODONE-acetaminophen 5-325 MG tablet Commonly known as: PERCOCET/ROXICET Take 1-2 tablets by mouth every 4 (four) hours as needed for moderate pain.   Pataday 0.1 % ophthalmic solution Generic drug: olopatadine Place 1 drop into both eyes daily as needed for allergies.   promethazine-dextromethorphan 6.25-15 MG/5ML syrup Commonly known as: PROMETHAZINE-DM Take 5 mLs by mouth at bedtime as needed.            Discharge Care Instructions  (From admission, onward)         Start     Ordered   07/22/19 0000  Discharge wound care:    Comments: You may apply a light dressing for minor discharge from the incision or to keep waistbands of clothing from rubbing.  You may also have been discharge with a clear dressing in which case this will be removed at your postoperative clinic visit.  You may shower, use soap on your incision.  Avoid baths or soaking the incision in the first 6 weeks following your surgery.Marland Kitchen   07/22/19 4270         Follow-up Information     Malachy Mood, MD In 1 week.   Specialty: Obstetrics and Gynecology Why: For wound re-check Contact information: [address] [place] [address] [phone]           Signed: Malachy Mood 07/22/2019, 8:22 AM

## 2019-07-25 LAB — SURGICAL PATHOLOGY

## 2019-07-26 ENCOUNTER — Other Ambulatory Visit: Payer: Self-pay

## 2019-07-26 ENCOUNTER — Ambulatory Visit (INDEPENDENT_AMBULATORY_CARE_PROVIDER_SITE_OTHER): Payer: Medicaid Other | Admitting: Obstetrics and Gynecology

## 2019-07-26 VITALS — BP 122/74 | Ht 63.0 in | Wt 146.0 lb

## 2019-07-26 DIAGNOSIS — Z4889 Encounter for other specified surgical aftercare: Secondary | ICD-10-CM

## 2019-07-26 NOTE — Progress Notes (Signed)
Postoperative Follow-up Patient presents post op from [place], BS, cystoscopy 1weeks ago for dysmenorrhea, pelvic pain.  Subjective: Patient reports marked improvement in her preop symptoms. Eating a regular diet without difficulty. Pain is controlled without any medications.  Activity: normal activities of daily living.  Objective: Blood pressure 122/74, height 5\' 3"  (1.6 m), weight 146 lb (66.2 kg), last menstrual period 07/07/2019.  General: NAD Pulmonary: no increased work of breathing Abdomen: soft, non-tender, non-distended, incision(s) D/C/I Extremities: no edema Neurologic: normal gait    Admission on 07/21/2019, Discharged on 07/22/2019  Component Date Value Ref Range Status  . ABO/RH(D) 07/21/2019    Final                   Value:O POS Performed at [facility], [address] [place], [place] [address]   . Preg Test, Ur 07/21/2019 NEGATIVE  NEGATIVE Final   Comment:        THE SENSITIVITY OF THIS METHODOLOGY IS >24 mIU/mL   . SURGICAL PATHOLOGY 07/21/2019    Final                   Value:Surgical Pathology CASE: [id number] PATIENT: Holly Middleton Surgical Pathology Report     SPECIMEN SUBMITTED: A. Uterus with cervix, bilateral tubes  CLINICAL HISTORY: None provided  PRE-OPERATIVE DIAGNOSIS: Dysmenorrhea, menorrhagia, CIN 1.  POST-OPERATIVE DIAGNOSIS: Same as pre-op    DIAGNOSIS: A. UTERUS WITH CERVIX; HYSTERECTOMY: - CERVIX WITH FOCAL LOW-GRADE SQUAMOUS INTRAEPITHELIAL LESION (CIN 1), AND CHANGES FROM PREVIOUS BIOPSY. - PROLIFERATIVE ENDOMETRIUM. - UNREMARKABLE MYOMETRIUM.  BILATERAL FALLOPIAN TUBES; SALPINGECTOMY: - NO PATHOLOGIC CHANGES.  GROSS DESCRIPTION: A. Labeled: Uterus with cervix, bilateral tubes Received: Formalin Weight: 97 grams (total) Dimensions:      Uterus - 5.7 x 5.2 x 3.5 cm      Cervix - 3.7 x 3.5 x 3.2 cm Serosa: Tan and smooth Cervix: The ectocervical mucosa at the approximate 5-6 o'clock position  displays a 0.5 x 0.4 cm indented defect (suspicious for previous biopsy site).  The remaining ectocervical mu                         cosa displays multifocal areas of minute hemorrhage and is otherwise pale tan-purple and grossly unremarkable.  The external os is slit-like, patent, and 1.1 cm in diameter.  The anterior cervical soft tissue resection margin is inked blue, and the posterior cervical soft tissue resection margin is inked black. Endocervix: 2.8 cm in length by 1.0 cm in diameter.  The endocervical mucosa is tan, striated, and grossly unremarkable. Endometrial cavity:      Dimensions - 5.2 cm in length by 2.0 cm cornu to cornu width      Thickness - 0.1-0.2 cm      Other findings - The endometrium is tan-red and smooth.  No polyps or abnormalities are grossly identified. Myometrium:     Thickness - 1.5 cm (average)     Other findings - Sectioning displays tan-pink, grossly unremarkable myometrium with no nodules or abnormalities grossly identified. Adnexa:      Right fallopian tube           Measurements - 6.2 cm in length x 0.6 cm in diameter           Other findings - Fimbriated.  T  he proximal aspect displays a white, plastic, grossly unremarkable tubal ligation clip.  The external surface is tan-purple, smooth, and finely vascular.  Sectioning displays a pinpoint, grossly unremarkable lumen.      Left fallopian tube            Measurements - 5.5 cm in length x 0.5 cm in diameter           Other findings - Fimbriated. The proximal aspect displays a white, plastic, grossly unremarkable tubal ligation clip.  The external surface is tan-purple, smooth, and finely vascular.  Sectioning displays a pinpoint, grossly unremarkable lumen.  Block summary: 1-4 - 12 to 3:00 cervix 5-8 - 3 to 6:00 cervix 9-12 - 6 to 9:00 cervix 13-16 - 9 to 12:00 cervix 17 - anterior lower uterine segment 18 - posterior lower uterine segment 19 - anterior  endomyometrium 20 - posterior endomyometrium 21 - right fallopian tube with longitudinally sectioned fimbria and cross-sections 22 - left fallopian tube with longitudinally sectioned fimbria and cross-sections   Fin                         al Diagnosis performed by Ronald LoboMary Olney, MD.   Electronically signed 07/25/2019 3:20:13PM The electronic signature indicates that the named Attending Pathologist has evaluated the specimen  Technical component performed at [facility], [address], [place], [place] [address] Lab: [phone] Dir: Jolene SchimkeSanjai Nagendra, MD, MMM  Professional component performed at [facility], [facility], [address], [place], [place] [address] Lab: [phone] Dir: Georgiann Cockerara C. Rubinas, MD   . WBC 07/22/2019 9.7  4.0 - 10.5 K/uL Final  . RBC 07/22/2019 3.94  3.87 - 5.11 MIL/uL Final  . Hemoglobin 07/22/2019 11.1* 12.0 - 15.0 g/dL Final  . HCT 72/53/664407/24/2020 34.1* 36.0 - 46.0 % Final  . MCV 07/22/2019 86.5  80.0 - 100.0 fL Final  . MCH 07/22/2019 28.2  26.0 - 34.0 pg Final  . MCHC 07/22/2019 32.6  30.0 - 36.0 g/dL Final  . RDW 03/47/425907/24/2020 14.0  11.5 - 15.5 % Final  . Platelets 07/22/2019 223  150 - 400 K/uL Final  . nRBC 07/22/2019 0.0  0.0 - 0.2 % Final   Performed at [facility], [address] [place], [place] [address]  . Sodium 07/22/2019 137  135 - 145 mmol/L Final  . Potassium 07/22/2019 4.2  3.5 - 5.1 mmol/L Final  . Chloride 07/22/2019 105  98 - 111 mmol/L Final  . CO2 07/22/2019 25  22 - 32 mmol/L Final  . Glucose, Bld 07/22/2019 95  70 - 99 mg/dL Final  . BUN 56/43/329507/24/2020 8  6 - 20 mg/dL Final  . Creatinine, Ser 07/22/2019 0.55  0.44 - 1.00 mg/dL Final  . Calcium 18/84/166007/24/2020 8.6* 8.9 - 10.3 mg/dL Final  . GFR calc non Af Amer 07/22/2019 >60  >60 mL/min Final  . GFR calc Af Amer 07/22/2019 >60  >60 mL/min Final  . Anion gap 07/22/2019 7  5 - 15 Final   Performed at [facility], [address] [place], [place] [address]     Assessment: 33 y.o. s/p TLH, BS, cystoscopy stable  Plan: Patient has done well after surgery with no apparent complications.  I have discussed the post-operative course to date, and the expected progress moving forward.  The patient understands what complications to be concerned about.  I will see the patient in routine follow up, or sooner if needed.    Activity plan:  No heavy lifting. No intercourse  Return in about 5 weeks (around 08/30/2019) for 6 week postop.    Vena AustriaAndreas Rowan Pollman, MD, Evern CoreFACOG [facility], [facility] 07/26/2019, 2:28 PM

## 2019-08-17 DIAGNOSIS — E890 Postprocedural hypothyroidism: Secondary | ICD-10-CM | POA: Diagnosis not present

## 2019-08-30 ENCOUNTER — Ambulatory Visit (INDEPENDENT_AMBULATORY_CARE_PROVIDER_SITE_OTHER): Payer: Medicaid Other | Admitting: Obstetrics and Gynecology

## 2019-08-30 ENCOUNTER — Encounter: Payer: Self-pay | Admitting: Obstetrics and Gynecology

## 2019-08-30 ENCOUNTER — Other Ambulatory Visit: Payer: Self-pay

## 2019-08-30 VITALS — BP 98/60 | Wt 148.0 lb

## 2019-08-30 DIAGNOSIS — Z4889 Encounter for other specified surgical aftercare: Secondary | ICD-10-CM

## 2019-08-30 DIAGNOSIS — E890 Postprocedural hypothyroidism: Secondary | ICD-10-CM | POA: Diagnosis not present

## 2019-08-30 NOTE — Progress Notes (Signed)
Postoperative Follow-up Patient presents post op from [place], BS, cystoscopy 6weeks ago for pelvic pain.  Subjective: Patient reports marked improvement in her preop symptoms. Eating a regular diet without difficulty. The patient is not having any pain.  Activity: normal activities of daily living.  Objective: Blood pressure 98/60, weight 148 lb (67.1 kg), last menstrual period 07/07/2019.  General: NAD Pulmonary: no increased work of breathing Abdomen: soft, non-tender, non-distended, incision(s) D/C/I GU: normal external female genitalia vaginal cuff intact, well healed Extremities: no edema Neurologic: normal gait    Admission on 07/21/2019, Discharged on 07/22/2019  Component Date Value Ref Range Status  . ABO/RH(D) 07/21/2019    Final                   Value:O POS Performed at [facility], [address] [place], [place] [address]   . Preg Test, Ur 07/21/2019 NEGATIVE  NEGATIVE Final   Comment:        THE SENSITIVITY OF THIS METHODOLOGY IS >24 mIU/mL   . SURGICAL PATHOLOGY 07/21/2019    Final                   Value:Surgical Pathology CASE: [id number] PATIENT: Holly Middleton Surgical Pathology Report     SPECIMEN SUBMITTED: A. Uterus with cervix, bilateral tubes  CLINICAL HISTORY: None provided  PRE-OPERATIVE DIAGNOSIS: Dysmenorrhea, menorrhagia, CIN 1.  POST-OPERATIVE DIAGNOSIS: Same as pre-op    DIAGNOSIS: A. UTERUS WITH CERVIX; HYSTERECTOMY: - CERVIX WITH FOCAL LOW-GRADE SQUAMOUS INTRAEPITHELIAL LESION (CIN 1), AND CHANGES FROM PREVIOUS BIOPSY. - PROLIFERATIVE ENDOMETRIUM. - UNREMARKABLE MYOMETRIUM.  BILATERAL FALLOPIAN TUBES; SALPINGECTOMY: - NO PATHOLOGIC CHANGES.  GROSS DESCRIPTION: A. Labeled: Uterus with cervix, bilateral tubes Received: Formalin Weight: 97 grams (total) Dimensions:      Uterus - 5.7 x 5.2 x 3.5 cm      Cervix - 3.7 x 3.5 x 3.2 cm Serosa: Tan and smooth Cervix: The ectocervical mucosa at the  approximate 5-6 o'clock position displays a 0.5 x 0.4 cm indented defect (suspicious for previous biopsy site).  The remaining ectocervical mu                         cosa displays multifocal areas of minute hemorrhage and is otherwise pale tan-purple and grossly unremarkable.  The external os is slit-like, patent, and 1.1 cm in diameter.  The anterior cervical soft tissue resection margin is inked blue, and the posterior cervical soft tissue resection margin is inked black. Endocervix: 2.8 cm in length by 1.0 cm in diameter.  The endocervical mucosa is tan, striated, and grossly unremarkable. Endometrial cavity:      Dimensions - 5.2 cm in length by 2.0 cm cornu to cornu width      Thickness - 0.1-0.2 cm      Other findings - The endometrium is tan-red and smooth.  No polyps or abnormalities are grossly identified. Myometrium:     Thickness - 1.5 cm (average)     Other findings - Sectioning displays tan-pink, grossly unremarkable myometrium with no nodules or abnormalities grossly identified. Adnexa:      Right fallopian tube           Measurements - 6.2 cm in length x 0.6 cm in diameter           Other findings - Fimbriated.  T  he proximal aspect displays a white, plastic, grossly unremarkable tubal ligation clip.  The external surface is tan-purple, smooth, and finely vascular.  Sectioning displays a pinpoint, grossly unremarkable lumen.      Left fallopian tube            Measurements - 5.5 cm in length x 0.5 cm in diameter           Other findings - Fimbriated. The proximal aspect displays a white, plastic, grossly unremarkable tubal ligation clip.  The external surface is tan-purple, smooth, and finely vascular.  Sectioning displays a pinpoint, grossly unremarkable lumen.  Block summary: 1-4 - 12 to 3:00 cervix 5-8 - 3 to 6:00 cervix 9-12 - 6 to 9:00 cervix 13-16 - 9 to 12:00 cervix 17 - anterior lower uterine segment 18 - posterior lower uterine  segment 19 - anterior endomyometrium 20 - posterior endomyometrium 21 - right fallopian tube with longitudinally sectioned fimbria and cross-sections 22 - left fallopian tube with longitudinally sectioned fimbria and cross-sections   Fin                         al Diagnosis performed by Bryan Lemma, MD.   Electronically signed 07/25/2019 3:20:13PM The electronic signature indicates that the named Attending Pathologist has evaluated the specimen  Technical component performed at [facility], [address] [place], [place] [address] Lab: [phone] Dir: Rush Farmer, MD, MMM  Professional component performed at [facility], [facility], [place], [place], [place] [address] Lab: [phone] Dir: Dellia Nims. Rubinas, MD   . WBC 07/22/2019 9.7  4.0 - 10.5 K/uL Final  . RBC 07/22/2019 3.94  3.87 - 5.11 MIL/uL Final  . Hemoglobin 07/22/2019 11.1* 12.0 - 15.0 g/dL Final  . HCT 07/22/2019 34.1* 36.0 - 46.0 % Final  . MCV 07/22/2019 86.5  80.0 - 100.0 fL Final  . MCH 07/22/2019 28.2  26.0 - 34.0 pg Final  . MCHC 07/22/2019 32.6  30.0 - 36.0 g/dL Final  . RDW 07/22/2019 14.0  11.5 - 15.5 % Final  . Platelets 07/22/2019 223  150 - 400 K/uL Final  . nRBC 07/22/2019 0.0  0.0 - 0.2 % Final   Performed at [facility], [address], [place], [place] [address]  . Sodium 07/22/2019 137  135 - 145 mmol/L Final  . Potassium 07/22/2019 4.2  3.5 - 5.1 mmol/L Final  . Chloride 07/22/2019 105  98 - 111 mmol/L Final  . CO2 07/22/2019 25  22 - 32 mmol/L Final  . Glucose, Bld 07/22/2019 95  70 - 99 mg/dL Final  . BUN 07/22/2019 8  6 - 20 mg/dL Final  . Creatinine, Ser 07/22/2019 0.55  0.44 - 1.00 mg/dL Final  . Calcium 07/22/2019 8.6* 8.9 - 10.3 mg/dL Final  . GFR calc non Af Amer 07/22/2019 >60  >60 mL/min Final  . GFR calc Af Amer 07/22/2019 >60  >60 mL/min Final  . Anion gap 07/22/2019 7  5 - 15 Final   Performed at [facility], [address]  [place], [place] [address]    Assessment: 33 y.o. s/p TLH, BS, cystoscopy stable  Plan: Patient has done well after surgery with no apparent complications.  I have discussed the post-operative course to date, and the expected progress moving forward.  The patient understands what complications to be concerned about.  I will see the patient in routine follow up, or sooner if needed.    Activity plan:  No restriction.   Vena Austria, MD, Evern Core [facility], [facility] 08/30/2019, 2:52 PM

## 2019-09-27 ENCOUNTER — Telehealth: Payer: Self-pay | Admitting: *Deleted

## 2019-09-27 ENCOUNTER — Other Ambulatory Visit: Payer: Self-pay

## 2019-09-27 DIAGNOSIS — Z20822 Contact with and (suspected) exposure to covid-19: Secondary | ICD-10-CM

## 2019-09-27 NOTE — Telephone Encounter (Signed)
Error

## 2019-09-28 ENCOUNTER — Ambulatory Visit: Payer: Self-pay | Admitting: Physician Assistant

## 2019-09-28 LAB — NOVEL CORONAVIRUS, NAA: SARS-CoV-2, NAA: NOT DETECTED

## 2019-10-07 ENCOUNTER — Ambulatory Visit (INDEPENDENT_AMBULATORY_CARE_PROVIDER_SITE_OTHER): Payer: Medicaid Other | Admitting: Physician Assistant

## 2019-10-07 ENCOUNTER — Other Ambulatory Visit: Payer: Self-pay

## 2019-10-07 ENCOUNTER — Encounter: Payer: Self-pay | Admitting: Physician Assistant

## 2019-10-07 VITALS — BP 93/68 | HR 62 | Temp 97.1°F | Resp 16 | Wt 145.2 lb

## 2019-10-07 DIAGNOSIS — L309 Dermatitis, unspecified: Secondary | ICD-10-CM

## 2019-10-07 DIAGNOSIS — S069X9S Unspecified intracranial injury with loss of consciousness of unspecified duration, sequela: Secondary | ICD-10-CM | POA: Diagnosis not present

## 2019-10-07 MED ORDER — TRIAMCINOLONE ACETONIDE 0.1 % EX CREA: 1.0000 "application " | TOPICAL_CREAM | Freq: Two times a day (BID) | CUTANEOUS | 0 refills | Status: DC

## 2019-10-07 NOTE — Progress Notes (Signed)
Patient: Holly Middleton Female    DOB: Jan 29, 1986   33 y.o.   MRN: [id number] Visit Date: 10/12/2019  Today's Provider: Trey Sailors, PA-C   Chief Complaint  Patient presents with  . Rash   Subjective:     HPI Patient here today c/o a "pimple" like lesion under her left eye x's one month. It oozed pus the other day and has since improved. Patient reports eczema on hands which typically worsens during the winter.  In the interim, her hysterectomy went well.  Allergies  Allergen Reactions  . Adhesive [Tape] Rash    Adhesives from bandaies Paper tape is ok to use.  . Lamotrigine Rash and Other (See Comments)    Rash, dark urine, yellow eyes.     Current Outpatient Medications:  .  albuterol (PROVENTIL HFA;VENTOLIN HFA) 108 (90 Base) MCG/ACT inhaler, Inhale 2 puffs into the lungs every 6 (six) hours as needed for wheezing or shortness of breath. , Disp: , Rfl:  .  divalproex (DEPAKOTE) 500 MG DR tablet, Take 500 mg by mouth 2 (two) times daily. , Disp: , Rfl:  .  fluticasone (FLONASE) 50 MCG/ACT nasal spray, Place 2 sprays into both nostrils daily as needed., Disp: 16 g, Rfl: 1 .  levETIRAcetam (KEPPRA) 750 MG tablet, Take 1 tablet (750 mg total) by mouth 2 (two) times daily., Disp: 60 tablet, Rfl: 0 .  levothyroxine (SYNTHROID) 100 MCG tablet, Take by mouth., Disp: , Rfl:  .  loratadine (CLARITIN) 10 MG tablet, Take 10 mg by mouth daily as needed for allergies., Disp: , Rfl:  .  triamcinolone cream (KENALOG) 0.1 %, Apply 1 application topically 2 (two) times daily., Disp: 30 g, Rfl: 0  Review of Systems  Social History   Tobacco Use  . Smoking status: Former Smoker    Quit date: 04/29/2016    Years since quitting: 3.4  . Smokeless tobacco: Never Used  Substance Use Topics  . Alcohol use: Yes    Frequency: Never    Comment: Very rarely      Objective:   BP 93/68 (BP Location: Right Arm, Patient Position: Sitting, Cuff Size: Normal)   Pulse 62   Temp (!)  97.1 F (36.2 C) (Temporal)   Resp 16   Wt 145 lb 3.2 oz (65.9 kg)   BMI 25.72 kg/m  Vitals:   10/07/19 1445  BP: 93/68  Pulse: 62  Resp: 16  Temp: (!) 97.1 F (36.2 C)  TempSrc: Temporal  Weight: 145 lb 3.2 oz (65.9 kg)  Body mass index is 25.72 kg/m.   Physical Exam Constitutional:      Appearance: Normal appearance.  HENT:     Head:   Skin:    General: Skin is warm and dry.  Neurological:     Mental Status: She is alert and oriented to person, place, and time. Mental status is at baseline.  Psychiatric:        Mood and Affect: Mood normal.        Behavior: Behavior normal.      No results found for any visits on 10/07/19.     Assessment & Plan    1. Eczema, unspecified type  - triamcinolone cream (KENALOG) 0.1 %; Apply 1 application topically 2 (two) times daily.  Dispense: 30 g; Refill: 0  2. Traumatic brain injury with loss of consciousness, sequela (HCC)  Followed by neurology.   The entirety of the information documented in the History  of Present Illness, Review of Systems and Physical Exam were personally obtained by me. Portions of this information were initially documented by Lynford Humphrey, CMA and reviewed by me for thoroughness and accuracy.        Trinna Post, PA-C  [facility]

## 2019-10-10 ENCOUNTER — Telehealth: Payer: Self-pay | Admitting: *Deleted

## 2019-10-10 NOTE — Telephone Encounter (Signed)
Patient's mother called office stating pt has an annoying cough at night and wants to know what Fabio Bering advises pt can take to help with that? Please advise?

## 2019-10-10 NOTE — Telephone Encounter (Signed)
She can take delsym or robitussin OTC.

## 2019-10-11 NOTE — Telephone Encounter (Signed)
LMTCB 10/11/2019   Thanks,   -Mickel Baas

## 2019-10-12 DIAGNOSIS — S069X9A Unspecified intracranial injury with loss of consciousness of unspecified duration, initial encounter: Secondary | ICD-10-CM

## 2019-10-12 HISTORY — DX: Unspecified intracranial injury with loss of consciousness of unspecified duration, initial encounter: S06.9X9A

## 2019-10-13 NOTE — Telephone Encounter (Signed)
lmtcb

## 2019-10-13 NOTE — Telephone Encounter (Signed)
Patient advised as below.  

## 2019-11-23 ENCOUNTER — Ambulatory Visit: Payer: Medicaid Other | Admitting: Physician Assistant

## 2019-11-23 ENCOUNTER — Other Ambulatory Visit: Payer: Self-pay

## 2019-11-23 VITALS — BP 108/77 | HR 61 | Temp 96.6°F | Resp 16 | Ht 62.0 in | Wt 142.4 lb

## 2019-11-23 DIAGNOSIS — R634 Abnormal weight loss: Secondary | ICD-10-CM | POA: Diagnosis not present

## 2019-11-23 DIAGNOSIS — Z23 Encounter for immunization: Secondary | ICD-10-CM | POA: Diagnosis not present

## 2019-11-23 DIAGNOSIS — E890 Postprocedural hypothyroidism: Secondary | ICD-10-CM | POA: Diagnosis not present

## 2019-11-23 NOTE — Progress Notes (Signed)
Patient: Holly Middleton Female    DOB: 20-Dec-1986   33 y.o.   MRN: [id number] Visit Date: 12/01/2019  Today's Provider: Trey Sailors, PA-C   Chief Complaint  Patient presents with  . Weight Loss   Subjective:    Weight Loss  Patient reports some unintentional weight loss over the past few months. 01/06/2019 she was 157 and today she is 142 lbs. Mother reports patient doesn't have as much appetite and is eating less. Patient does endorse reduced appetite but is unsure why. She is followed by [facility] endocrinology who treats her hypothyroidism. Last TSH 08/17/2019 slightly over corrected at 0.210. Denies fevers, chills, nausea, vomiting, night sweats.   Patient is here to discuss Weight Loss. She recently had a Historectomy and started a diet quiting soda, cofffee and lightened her food intake. She has lost a total of 30lbs in just a few months. She is not exercising and has started drinking sodas again but is still loosing weight. She is drinking Ensures to help with protein.   Wt Readings from Last 3 Encounters:  11/23/19 142 lb 6.4 oz (64.6 kg)  10/07/19 145 lb 3.2 oz (65.9 kg)  08/30/19 148 lb (67.1 kg)     Allergies  Allergen Reactions  . Adhesive [Tape] Rash    Adhesives from bandaies Paper tape is ok to use.  . Lamotrigine Rash and Other (See Comments)    Rash, dark urine, yellow eyes.     Current Outpatient Medications:  .  albuterol (PROVENTIL HFA;VENTOLIN HFA) 108 (90 Base) MCG/ACT inhaler, Inhale 2 puffs into the lungs every 6 (six) hours as needed for wheezing or shortness of breath. , Disp: , Rfl:  .  fluticasone (FLONASE) 50 MCG/ACT nasal spray, Place 2 sprays into both nostrils daily as needed., Disp: 16 g, Rfl: 1 .  levETIRAcetam (KEPPRA) 750 MG tablet, Take 1 tablet (750 mg total) by mouth 2 (two) times daily., Disp: 60 tablet, Rfl: 0 .  levothyroxine (SYNTHROID) 100 MCG tablet, Take by mouth., Disp: , Rfl:  .  loratadine (CLARITIN) 10 MG  tablet, Take 10 mg by mouth daily as needed for allergies., Disp: , Rfl:  .  Melatonin 3 MG CAPS, Take by mouth., Disp: , Rfl:  .  triamcinolone cream (KENALOG) 0.1 %, Apply 1 application topically 2 (two) times daily., Disp: 30 g, Rfl: 0 .  divalproex (DEPAKOTE) 500 MG DR tablet, Take 500 mg by mouth 2 (two) times daily. , Disp: , Rfl:   Review of Systems  Constitutional: Negative.   Respiratory: Negative.   Genitourinary: Negative.   Neurological: Negative.     Social History   Tobacco Use  . Smoking status: Former Smoker    Quit date: 04/29/2016    Years since quitting: 3.5  . Smokeless tobacco: Never Used  Substance Use Topics  . Alcohol use: Yes    Frequency: Never    Comment: Very rarely      Objective:   BP 108/77   Pulse 61   Temp (!) 96.6 F (35.9 C) (Temporal)   Resp 16   Ht 5\' 2"  (1.575 m)   Wt 142 lb 6.4 oz (64.6 kg)   LMP  (LMP Unknown)   BMI 26.05 kg/m  Vitals:   11/23/19 1343  BP: 108/77  Pulse: 61  Resp: 16  Temp: (!) 96.6 F (35.9 C)  TempSrc: Temporal  Weight: 142 lb 6.4 oz (64.6 kg)  Height: 5\' 2"  (1.575 m)  Body mass index is 26.05 kg/m.   Physical Exam Constitutional:      General: She is not in acute distress.    Appearance: Normal appearance. She is normal weight. She is not ill-appearing.  Cardiovascular:     Rate and Rhythm: Normal rate and regular rhythm.     Heart sounds: Normal heart sounds.  Pulmonary:     Effort: Pulmonary effort is normal.     Breath sounds: Normal breath sounds.  Skin:    General: Skin is warm and dry.  Neurological:     Mental Status: She is alert and oriented to person, place, and time. Mental status is at baseline.  Psychiatric:        Mood and Affect: Mood normal.        Behavior: Behavior normal.      No results found for any visits on 11/23/19.     Assessment & Plan    1. Weight loss  Might be due to overcorrection on thyroid which has been present since 01/2019. She has an upcoming visit  with endocrinologist. Continue to monitor.   2. Need for influenza vaccination  - Flu Vaccine QUAD 6+ mos PF IM (Fluarix Quad PF)  The entirety of the information documented in the History of Present Illness, Review of Systems and Physical Exam were personally obtained by me. Portions of this information were initially documented by [facility], CMA and reviewed by me for thoroughness and accuracy.    F/u PRN     Trinna Post, PA-C  [facility]

## 2019-11-23 NOTE — Patient Instructions (Signed)
Seizure, Adult A seizure is a sudden burst of abnormal electrical activity in the brain. Seizures usually last from 30 seconds to 2 minutes. They can cause many different symptoms. Usually, seizures are not harmful unless they last a long time. What are the causes? Common causes of this condition include:  Fever or infection.  Conditions that affect the brain, such as: ? A brain abnormality that you were born with. ? A brain or head injury. ? Bleeding in the brain. ? A tumor. ? Stroke. ? Brain disorders such as autism or cerebral palsy.  Low blood sugar.  Conditions that are passed from parent to child (are inherited).  Problems with substances, such as: ? Having a reaction to a drug or a medicine. ? Suddenly stopping the use of a substance (withdrawal). In some cases, the cause may not be known. A person who has repeated seizures over time without a clear cause has a condition called epilepsy. What increases the risk? You are more likely to get this condition if you have:  A family history of epilepsy.  Had a seizure in the past.  A brain disorder.  A history of head injury, lack of oxygen at birth, or strokes. What are the signs or symptoms? There are many types of seizures. The symptoms vary depending on the type of seizure you have. Examples of symptoms during a seizure include:  Shaking (convulsions).  Stiffness in the body.  Passing out (losing consciousness).  Head nodding.  Staring.  Not responding to sound or touch.  Loss of bladder control and bowel control. Some people have symptoms right before and right after a seizure happens. Symptoms before a seizure may include:  Fear.  Worry (anxiety).  Feeling like you may vomit (nauseous).  Feeling like the room is spinning (vertigo).  Feeling like you saw or heard something before (dj vu).  Odd tastes or smells.  Changes in how you see. You may see flashing lights or spots. Symptoms after a  seizure happens can include:  Confusion.  Sleepiness.  Headache.  Weakness on one side of the body. How is this treated? Most seizures will stop on their own in under 5 minutes. In these cases, no treatment is needed. Seizures that last longer than 5 minutes will usually need treatment. Treatment can include:  Medicines given through an IV tube.  Avoiding things that are known to cause your seizures. These can include medicines that you take for another condition.  Medicines to treat epilepsy.  Surgery to stop the seizures. This may be needed if medicines do not help. Follow these instructions at home: Medicines  Take over-the-counter and prescription medicines only as told by your doctor.  Do not eat or drink anything that may keep your medicine from working, such as alcohol. Activity  Do not do any activities that would be dangerous if you had another seizure, like driving or swimming. Wait until your doctor says it is safe for you to do them.  If you live in the [place] ask your local DMV (department of motor vehicles) when you can drive.  Get plenty of rest. Teaching others Teach friends and family what to do when you have a seizure. They should:  Lay you on the ground.  Protect your head and body.  Loosen any tight clothing around your neck.  Turn you on your side.  Not hold you down.  Not put anything into your mouth.  Know whether or not you need emergency care.  Stay   with you until you are better.  General instructions  Contact your doctor each time you have a seizure.  Avoid anything that gives you seizures.  Keep a seizure diary. Write down: ? What you think caused each seizure. ? What you remember about each seizure.  Keep all follow-up visits as told by your doctor. This is important. Contact a doctor if:  You have another seizure.  You have seizures more often.  There is any change in what happens during your seizures.  You keep having  seizures with treatment.  You have symptoms of being sick or having an infection. Get help right away if:  You have a seizure that: ? Lasts longer than 5 minutes. ? Is different than seizures you had before. ? Makes it harder to breathe. ? Happens after you hurt your head.  You have any of these symptoms after a seizure: ? Not being able to speak. ? Not being able to use a part of your body. ? Confusion. ? A bad headache.  You have two or more seizures in a row.  You do not wake up right after a seizure.  You get hurt during a seizure. These symptoms may be an emergency. Do not wait to see if the symptoms will go away. Get medical help right away. Call your local emergency services (911 in the [place] Do not drive yourself to the hospital. Summary  Seizures usually last from 30 seconds to 2 minutes. Usually, they are not harmful unless they last a long time.  Do not eat or drink anything that may keep your medicine from working, such as alcohol.  Teach friends and family what to do when you have a seizure.  Contact your doctor each time you have a seizure. This information is not intended to replace advice given to you by your health care provider. Make sure you discuss any questions you have with your health care provider. Document Released: 06/02/2008 Document Revised: 03/04/2019 Document Reviewed: 03/04/2019 Elsevier Patient Education  2020 Elsevier Inc.  

## 2019-11-29 DIAGNOSIS — E890 Postprocedural hypothyroidism: Secondary | ICD-10-CM | POA: Diagnosis not present

## 2020-02-15 DIAGNOSIS — G479 Sleep disorder, unspecified: Secondary | ICD-10-CM | POA: Diagnosis not present

## 2020-02-15 DIAGNOSIS — R519 Headache, unspecified: Secondary | ICD-10-CM | POA: Diagnosis not present

## 2020-02-15 DIAGNOSIS — R569 Unspecified convulsions: Secondary | ICD-10-CM | POA: Diagnosis not present

## 2020-02-15 DIAGNOSIS — F411 Generalized anxiety disorder: Secondary | ICD-10-CM | POA: Diagnosis not present

## 2020-02-19 DIAGNOSIS — F411 Generalized anxiety disorder: Secondary | ICD-10-CM | POA: Insufficient documentation

## 2020-02-19 DIAGNOSIS — G479 Sleep disorder, unspecified: Secondary | ICD-10-CM | POA: Insufficient documentation

## 2020-04-04 ENCOUNTER — Other Ambulatory Visit: Payer: Self-pay | Admitting: Physician Assistant

## 2020-04-11 DIAGNOSIS — R519 Headache, unspecified: Secondary | ICD-10-CM | POA: Diagnosis not present

## 2020-04-11 DIAGNOSIS — F411 Generalized anxiety disorder: Secondary | ICD-10-CM | POA: Diagnosis not present

## 2020-04-11 DIAGNOSIS — G479 Sleep disorder, unspecified: Secondary | ICD-10-CM | POA: Diagnosis not present

## 2020-04-11 DIAGNOSIS — R569 Unspecified convulsions: Secondary | ICD-10-CM | POA: Diagnosis not present

## 2020-04-16 DIAGNOSIS — Z79899 Other long term (current) drug therapy: Secondary | ICD-10-CM | POA: Diagnosis not present

## 2020-04-16 DIAGNOSIS — F411 Generalized anxiety disorder: Secondary | ICD-10-CM | POA: Diagnosis not present

## 2020-04-16 DIAGNOSIS — R519 Headache, unspecified: Secondary | ICD-10-CM | POA: Diagnosis not present

## 2020-04-16 DIAGNOSIS — G479 Sleep disorder, unspecified: Secondary | ICD-10-CM | POA: Diagnosis not present

## 2020-04-16 DIAGNOSIS — R569 Unspecified convulsions: Secondary | ICD-10-CM | POA: Diagnosis not present

## 2020-06-18 NOTE — Progress Notes (Signed)
Established patient visit   Patient: Holly Middleton   DOB: Jun 24, 1986   34 y.o. Female  MRN: [id number] Visit Date: 06/20/2020  Today's healthcare provider: Trinna Post, PA-C   Chief Complaint  Patient presents with  . Diarrhea  I,Siniya Lichty M Surya Schroeter,acting as a scribe for Trinna Post, PA-C.,have documented all relevant documentation on the behalf of Trinna Post, PA-C,as directed by  Trinna Post, PA-C while in the presence of Trinna Post, PA-C.  Subjective    Diarrhea  This is a new problem. The current episode started more than 1 month ago. The problem occurs 2 to 4 times per day. The problem has been gradually worsening. The stool consistency is described as watery. The patient states that diarrhea awakens her from sleep. Pertinent negatives include no abdominal pain, bloating, chills, coughing or vomiting. She has tried nothing for the symptoms. The treatment provided no relief.  Patient states that her diarrhea is uncontrollable and is requesting a GI referral. Patient has had gallbladder removed. She has had some baseline digestive issues since this but this feels different. No joint pain or muscle pain.       Medications: Outpatient Medications Prior to Visit  Medication Sig  . albuterol (PROVENTIL HFA;VENTOLIN HFA) 108 (90 Base) MCG/ACT inhaler Inhale 2 puffs into the lungs every 6 (six) hours as needed for wheezing or shortness of breath.   . fluticasone (FLONASE) 50 MCG/ACT nasal spray USE 2 SPRAY(S) IN EACH NOSTRIL ONCE DAILY AS NEEDED  . levETIRAcetam (KEPPRA) 750 MG tablet Take 1 tablet (750 mg total) by mouth 2 (two) times daily.  Marland Kitchen levothyroxine (SYNTHROID) 100 MCG tablet Take by mouth.  . loratadine (CLARITIN) 10 MG tablet Take 10 mg by mouth daily as needed for allergies.  . Melatonin 3 MG CAPS Take by mouth.  . triamcinolone cream (KENALOG) 0.1 % Apply 1 application topically 2 (two) times daily.  . divalproex (DEPAKOTE) 500 MG DR tablet  Take 500 mg by mouth 2 (two) times daily.    No facility-administered medications prior to visit.    Review of Systems  Constitutional: Negative.  Negative for chills.  Respiratory: Negative.  Negative for cough.   Gastrointestinal: Positive for diarrhea. Negative for abdominal pain, bloating and vomiting.      Objective    BP 122/78 (BP Location: Right Arm, Patient Position: Sitting, Cuff Size: Normal)   Pulse 65   Temp (!) 96.2 F (35.7 C) (Temporal)   Wt 143 lb 12.8 oz (65.2 kg)   LMP  (LMP Unknown)   SpO2 99%   BMI 26.30 kg/m    Physical Exam Constitutional:      Appearance: Normal appearance. She is normal weight.  Cardiovascular:     Rate and Rhythm: Normal rate and regular rhythm.     Heart sounds: Normal heart sounds.  Pulmonary:     Effort: Pulmonary effort is normal.     Breath sounds: Normal breath sounds.  Abdominal:     General: Bowel sounds are normal.     Palpations: Abdomen is soft.  Skin:    General: Skin is warm and dry.  Neurological:     General: No focal deficit present.     Mental Status: She is alert and oriented to person, place, and time.  Psychiatric:        Mood and Affect: Mood normal.        Behavior: Behavior normal.       Results for  orders placed or performed in visit on 06/20/20  CBC with Differential/Platelet  Result Value Ref Range   WBC 6.1 3.4 - 10.8 x10E3/uL   RBC 3.95 3.77 - 5.28 x10E6/uL   Hemoglobin 13.0 11.1 - 15.9 g/dL   Hematocrit 76.2 83.1 - 46.6 %   MCV 95 79 - 97 fL   MCH 32.9 26.6 - 33.0 pg   MCHC 34.8 31 - 35 g/dL   RDW 51.7 61.6 - 07.3 %   Platelets 221 150 - 450 x10E3/uL   Neutrophils 59 Not Estab. %   Lymphs 32 Not Estab. %   Monocytes 7 Not Estab. %   Eos 1 Not Estab. %   Basos 0 Not Estab. %   Neutrophils Absolute 3.6 1 - 7 x10E3/uL   Lymphocytes Absolute 2.0 0 - 3 x10E3/uL   Monocytes Absolute 0.4 0 - 0 x10E3/uL   EOS (ABSOLUTE) 0.1 0.0 - 0.4 x10E3/uL   Basophils Absolute 0.0 0 - 0 x10E3/uL    Immature Granulocytes 1 Not Estab. %   Immature Grans (Abs) 0.0 0.0 - 0.1 x10E3/uL  Comprehensive metabolic panel  Result Value Ref Range   Glucose 75 65 - 99 mg/dL   BUN 5 (L) 6 - 20 mg/dL   Creatinine, Ser 7.10 0.57 - 1.00 mg/dL   GFR calc non Af Amer 108 >59 mL/min/1.73   GFR calc Af Amer 124 >59 mL/min/1.73   BUN/Creatinine Ratio 7 (L) 9 - 23   Sodium 139 134 - 144 mmol/L   Potassium 4.2 3.5 - 5.2 mmol/L   Chloride 103 96 - 106 mmol/L   CO2 24 20 - 29 mmol/L   Calcium 8.7 8.7 - 10.2 mg/dL   Total Protein 6.9 6.0 - 8.5 g/dL   Albumin 4.2 3.8 - 4.8 g/dL   Globulin, Total 2.7 1.5 - 4.5 g/dL   Albumin/Globulin Ratio 1.6 1.2 - 2.2   Bilirubin Total 0.4 0.0 - 1.2 mg/dL   Alkaline Phosphatase 49 48 - 121 IU/L   AST 12 0 - 40 IU/L   ALT 6 0 - 32 IU/L  Tissue transglutaminase, IgA  Result Value Ref Range   Transglutaminase IgA WILL FOLLOW   Sedimentation rate  Result Value Ref Range   Sed Rate 2 0 - 32 mm/hr  IgA  Result Value Ref Range   IgA/Immunoglobulin A, Serum 239 87 - 352 mg/dL  C-reactive protein  Result Value Ref Range   CRP <1 0 - 10 mg/L    Assessment & Plan    1. Diarrhea, unspecified type Patient reports uncontrolled diarrhea for more than 2 months now. Patient requested a referral to GI and referral was placed as below. Initial labs as below.   - Ambulatory referral to Gastroenterology - CBC with Differential/Platelet - Comprehensive metabolic panel - Sedimentation rate - C-reactive protein - Tissue transglutaminase, IgA - IgA   No follow-ups on file.      ITrey Sailors, PA-C, have reviewed all documentation for this visit. The documentation on 06/21/20 for the exam, diagnosis, procedures, and orders are all accurate and complete.    Maryella Shivers  [facility] [phone] (phone) [phone] (fax)  [facility]

## 2020-06-20 ENCOUNTER — Ambulatory Visit (INDEPENDENT_AMBULATORY_CARE_PROVIDER_SITE_OTHER): Payer: Medicaid Other | Admitting: Physician Assistant

## 2020-06-20 ENCOUNTER — Encounter: Payer: Self-pay | Admitting: Physician Assistant

## 2020-06-20 ENCOUNTER — Other Ambulatory Visit: Payer: Self-pay

## 2020-06-20 ENCOUNTER — Other Ambulatory Visit: Payer: Self-pay | Admitting: Physician Assistant

## 2020-06-20 VITALS — BP 122/78 | HR 65 | Temp 96.2°F | Wt 143.8 lb

## 2020-06-20 DIAGNOSIS — R569 Unspecified convulsions: Secondary | ICD-10-CM

## 2020-06-20 DIAGNOSIS — S069X9S Unspecified intracranial injury with loss of consciousness of unspecified duration, sequela: Secondary | ICD-10-CM

## 2020-06-20 DIAGNOSIS — R197 Diarrhea, unspecified: Secondary | ICD-10-CM | POA: Diagnosis not present

## 2020-06-22 LAB — C-REACTIVE PROTEIN: CRP: <1 mg/L (ref 0–10)

## 2020-06-22 LAB — CBC WITH DIFFERENTIAL/PLATELET
Basophils Absolute: 0 10*3/uL (ref 0.0–0.2)
Basos: 0 %
EOS (ABSOLUTE): 0.1 10*3/uL (ref 0.0–0.4)
Eos: 1 %
Hematocrit: 37.4 % (ref 34.0–46.6)
Hemoglobin: 13 g/dL (ref 11.1–15.9)
Immature Grans (Abs): 0 10*3/uL (ref 0.0–0.1)
Immature Granulocytes: 1 %
Lymphocytes Absolute: 2 10*3/uL (ref 0.7–3.1)
Lymphs: 32 %
MCH: 32.9 pg (ref 26.6–33.0)
MCHC: 34.8 g/dL (ref 31.5–35.7)
MCV: 95 fL (ref 79–97)
Monocytes Absolute: 0.4 10*3/uL (ref 0.1–0.9)
Monocytes: 7 %
Neutrophils Absolute: 3.6 10*3/uL (ref 1.4–7.0)
Neutrophils: 59 %
Platelets: 221 10*3/uL (ref 150–450)
RBC: 3.95 x10E6/uL (ref 3.77–5.28)
RDW: 14 % (ref 11.7–15.4)
WBC: 6.1 10*3/uL (ref 3.4–10.8)

## 2020-06-22 LAB — COMPREHENSIVE METABOLIC PANEL
ALT: 6 IU/L (ref 0–32)
AST: 12 IU/L (ref 0–40)
Albumin/Globulin Ratio: 1.6 (ref 1.2–2.2)
Albumin: 4.2 g/dL (ref 3.8–4.8)
Alkaline Phosphatase: 49 IU/L (ref 48–121)
BUN/Creatinine Ratio: 7 — ABNORMAL LOW (ref 9–23)
BUN: 5 mg/dL — ABNORMAL LOW (ref 6–20)
Bilirubin Total: 0.4 mg/dL (ref 0.0–1.2)
CO2: 24 mmol/L (ref 20–29)
Calcium: 8.7 mg/dL (ref 8.7–10.2)
Chloride: 103 mmol/L (ref 96–106)
Creatinine, Ser: 0.73 mg/dL (ref 0.57–1.00)
GFR calc Af Amer: 124 mL/min/{1.73_m2} (ref >59)
GFR calc non Af Amer: 108 mL/min/{1.73_m2} (ref >59)
Globulin, Total: 2.7 g/dL (ref 1.5–4.5)
Glucose: 75 mg/dL (ref 65–99)
Potassium: 4.2 mmol/L (ref 3.5–5.2)
Sodium: 139 mmol/L (ref 134–144)
Total Protein: 6.9 g/dL (ref 6.0–8.5)

## 2020-06-22 LAB — IGA: IgA/Immunoglobulin A, Serum: 239 mg/dL (ref 87–352)

## 2020-06-22 LAB — SEDIMENTATION RATE: Sed Rate: 2 mm/hr (ref 0–32)

## 2020-06-22 LAB — TISSUE TRANSGLUTAMINASE, IGA: Transglutaminase IgA: <2 U/mL (ref 0–3)

## 2020-06-25 DIAGNOSIS — H53461 Homonymous bilateral field defects, right side: Secondary | ICD-10-CM | POA: Diagnosis not present

## 2020-09-05 ENCOUNTER — Encounter: Payer: Self-pay | Admitting: Obstetrics and Gynecology

## 2020-09-05 ENCOUNTER — Ambulatory Visit (INDEPENDENT_AMBULATORY_CARE_PROVIDER_SITE_OTHER): Payer: Medicaid Other | Admitting: Obstetrics and Gynecology

## 2020-09-05 ENCOUNTER — Other Ambulatory Visit: Payer: Self-pay

## 2020-09-05 VITALS — BP 100/70 | Ht 63.0 in | Wt 145.0 lb

## 2020-09-05 DIAGNOSIS — Z01419 Encounter for gynecological examination (general) (routine) without abnormal findings: Secondary | ICD-10-CM | POA: Diagnosis not present

## 2020-09-05 DIAGNOSIS — Z1239 Encounter for other screening for malignant neoplasm of breast: Secondary | ICD-10-CM | POA: Diagnosis not present

## 2020-09-05 DIAGNOSIS — Z23 Encounter for immunization: Secondary | ICD-10-CM | POA: Diagnosis not present
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
## 2020-09-21 DIAGNOSIS — E890 Postprocedural hypothyroidism: Secondary | ICD-10-CM | POA: Diagnosis not present

## 2020-09-26 DIAGNOSIS — E890 Postprocedural hypothyroidism: Secondary | ICD-10-CM | POA: Diagnosis not present

## 2020-10-01 ENCOUNTER — Telehealth: Payer: Self-pay

## 2020-10-01 NOTE — Telephone Encounter (Signed)
FYI

## 2020-10-01 NOTE — Telephone Encounter (Signed)
Pt decided to keep appointment with Dr Allegra Lai 10/17/20. To get in at [facility] will be Jan 2022

## 2020-10-01 NOTE — Telephone Encounter (Signed)
Copied from CRM [id number]. Topic: General - Other >> Oct 01, 2020 10:03 AM Dalphine Handing A wrote: Patient would like a callback from referral coordinator in regards to the office that she was referred to not contacting them for 2 months and wanting a new referral placed to [facility]. Please aadvise  [phone]

## 2020-10-15 ENCOUNTER — Other Ambulatory Visit: Payer: Self-pay

## 2020-10-17 ENCOUNTER — Other Ambulatory Visit: Payer: Self-pay

## 2020-10-17 ENCOUNTER — Encounter: Payer: Self-pay | Admitting: Gastroenterology

## 2020-10-17 ENCOUNTER — Ambulatory Visit: Payer: Medicaid Other | Admitting: Gastroenterology

## 2020-10-17 VITALS — BP 103/67 | HR 64 | Temp 98.0°F | Ht 63.0 in | Wt 148.5 lb

## 2020-10-17 DIAGNOSIS — K529 Noninfective gastroenteritis and colitis, unspecified: Secondary | ICD-10-CM

## 2020-10-17 DIAGNOSIS — K625 Hemorrhage of anus and rectum: Secondary | ICD-10-CM

## 2020-10-17 DIAGNOSIS — K219 Gastro-esophageal reflux disease without esophagitis: Secondary | ICD-10-CM

## 2020-10-17 MED ORDER — NA SULFATE-K SULFATE-MG SULF 17.5-3.13-1.6 GM/177ML PO SOLN: 354.0000 mL | Freq: Once | ORAL | 0 refills | Status: AC

## 2020-10-17 NOTE — Patient Instructions (Addendum)
°  1. Please take omeprazole 20mg  over the counter 1-2 times a day before meals.  2. Take Imodium 2mg  as needed up to 4 times a day  3. Follow up in 2 months   Please call our office to speak with my nurse [phone] during business hours from 8am to 4pm if you have any questions/concerns. During after hours, you will be redirected to on call GI physician. For any emergency please call 911 or go the nearest emergency room.    Radene Knee, MD [address]  [address]  [place], [address] [place]  Main: [phone]  Fax: [phone]     Food Choices for Gastroesophageal Reflux Disease, Adult When you have gastroesophageal reflux disease (GERD), the foods you eat and your eating habits are very important. Choosing the right foods can help ease your discomfort. Think about working with a nutrition specialist (dietitian) to help you make good choices. What are tips for following this plan?  Meals  Choose healthy foods that are low in fat, such as fruits, vegetables, whole grains, low-fat dairy products, and lean meat, fish, and poultry.  Eat small meals often instead of 3 large meals a day. Eat your meals slowly, and in a place where you are relaxed. Avoid bending over or lying down until 2-3 hours after eating.  Avoid eating meals 2-3 hours before bed.  Avoid drinking a lot of liquid with meals.  Cook foods using methods other than frying. Bake, grill, or broil food instead.  Avoid or limit: ? Chocolate. ? Peppermint or spearmint. ? Alcohol. ? Pepper. ? Black and decaffeinated coffee. ? Black and decaffeinated tea. ? Bubbly (carbonated) soft drinks. ? Caffeinated energy drinks and soft drinks.  Limit high-fat foods such as: ? Fatty meat or fried foods. ? Whole milk, cream, butter, or ice cream. ? Nuts and nut butters. ? Pastries, donuts, and sweets made with butter or shortening.  Avoid foods that cause symptoms. These foods may be different for  everyone. Common foods that cause symptoms include: ? Tomatoes. ? Oranges, lemons, and limes. ? Peppers. ? Spicy food. ? Onions and garlic. ? Vinegar. Lifestyle  Maintain a healthy weight. Ask your doctor what weight is healthy for you. If you need to lose weight, work with your doctor to do so safely.  Exercise for at least 30 minutes for 5 or more days each week, or as told by your doctor.  Wear loose-fitting clothes.  Do not smoke. If you need help quitting, ask your doctor.  Sleep with the head of your bed higher than your feet. Use a wedge under the mattress or blocks under the bed frame to raise the head of the bed. Summary  When you have gastroesophageal reflux disease (GERD), food and lifestyle choices are very important in easing your symptoms.  Eat small meals often instead of 3 large meals a day. Eat your meals slowly, and in a place where you are relaxed.  Limit high-fat foods such as fatty meat or fried foods.  Avoid bending over or lying down until 2-3 hours after eating.  Avoid peppermint and spearmint, caffeine, alcohol, and chocolate. This information is not intended to replace advice given to you by your health care provider. Make sure you discuss any questions you have with your health care provider. Document Revised: 04/07/2019 Document Reviewed: 01/20/2017 Elsevier Patient Education  2020 06/07/2019.
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
## 2020-10-18 ENCOUNTER — Telehealth: Payer: Self-pay

## 2020-10-18 NOTE — Telephone Encounter (Signed)
Per French Ana she has to call the patient this morning because her insurance is stating that member Is not eligible for dates of services requested. Member has healthy blue so they are usually covered.

## 2020-10-18 NOTE — Telephone Encounter (Signed)
Patient actually has [place] health care medicaid. She is good now to have procedure.

## 2020-10-19 ENCOUNTER — Telehealth: Payer: Self-pay

## 2020-10-19 LAB — H. PYLORI BREATH TEST: H pylori Breath Test: NEGATIVE

## 2020-10-19 NOTE — Telephone Encounter (Signed)
-----   Message from [name], MD sent at 10/19/2020 10:02 AM EDT ----- H. pylori breath test is negative which is good.  Waiting on stool studies to rule out infectionRohini Middleton

## 2020-10-19 NOTE — Telephone Encounter (Signed)
Patient mother verbalized understanding and states she will bring the stool test next week

## 2020-10-29 ENCOUNTER — Other Ambulatory Visit
Admission: RE | Admit: 2020-10-29 | Discharge: 2020-10-29 | Disposition: A | Discharge status: Home / Self Care | Payer: Medicaid Other | Source: Ambulatory Visit | Attending: Gastroenterology | Admitting: Gastroenterology

## 2020-10-29 ENCOUNTER — Other Ambulatory Visit: Payer: Self-pay

## 2020-10-29 DIAGNOSIS — Z01818 Encounter for other preprocedural examination: Secondary | ICD-10-CM | POA: Insufficient documentation

## 2020-10-29 DIAGNOSIS — Z20822 Contact with and (suspected) exposure to covid-19: Secondary | ICD-10-CM | POA: Insufficient documentation

## 2020-10-29 LAB — SARS CORONAVIRUS 2 (TAT 6-24 HRS): SARS Coronavirus 2: NEGATIVE

## 2020-10-30 ENCOUNTER — Telehealth: Payer: Self-pay | Admitting: Gastroenterology

## 2020-10-30 ENCOUNTER — Encounter: Payer: Self-pay | Admitting: Gastroenterology

## 2020-10-30 NOTE — Telephone Encounter (Signed)
Patient mother verbalized understanding  

## 2020-10-30 NOTE — Telephone Encounter (Signed)
Patient mother states her and pt were never told if she could take certain medications before procedure TOMORROW 11.3.2. Pt mom wants to know if pt can take morning doses of depakote, keppra and one other medication. Pt mother states the endo center old her to call here and find out. Please advise and let pt mother know, who will be waiting for a call. Pt had ov on 10.20.21.

## 2020-10-30 NOTE — Telephone Encounter (Signed)
Please advised  

## 2020-10-30 NOTE — Telephone Encounter (Signed)
She can take morning doses of all of her medications.  Make sure she does it at least 2 to 3 hours before her scheduled procedure  RV

## 2020-10-31 ENCOUNTER — Ambulatory Visit: Payer: Medicaid Other | Admitting: Anesthesiology

## 2020-10-31 ENCOUNTER — Other Ambulatory Visit: Payer: Self-pay

## 2020-10-31 ENCOUNTER — Encounter: Payer: Self-pay | Admitting: Gastroenterology

## 2020-10-31 ENCOUNTER — Encounter
Admission: RE | Disposition: A | Discharge status: Home / Self Care | Payer: Self-pay | Source: Home / Self Care | Attending: Gastroenterology

## 2020-10-31 ENCOUNTER — Ambulatory Visit
Admission: RE | Admit: 2020-10-31 | Discharge: 2020-10-31 | Disposition: A | Discharge status: Home / Self Care | Payer: Medicaid Other | Attending: Gastroenterology | Admitting: Gastroenterology

## 2020-10-31 DIAGNOSIS — K2960 Other gastritis without bleeding: Secondary | ICD-10-CM | POA: Insufficient documentation

## 2020-10-31 DIAGNOSIS — K644 Residual hemorrhoidal skin tags: Secondary | ICD-10-CM | POA: Insufficient documentation

## 2020-10-31 DIAGNOSIS — K219 Gastro-esophageal reflux disease without esophagitis: Secondary | ICD-10-CM | POA: Diagnosis not present

## 2020-10-31 DIAGNOSIS — J45909 Unspecified asthma, uncomplicated: Secondary | ICD-10-CM | POA: Diagnosis not present

## 2020-10-31 DIAGNOSIS — K529 Noninfective gastroenteritis and colitis, unspecified: Secondary | ICD-10-CM

## 2020-10-31 DIAGNOSIS — K649 Unspecified hemorrhoids: Secondary | ICD-10-CM | POA: Diagnosis not present

## 2020-10-31 DIAGNOSIS — E039 Hypothyroidism, unspecified: Secondary | ICD-10-CM | POA: Diagnosis not present

## 2020-10-31 DIAGNOSIS — F411 Generalized anxiety disorder: Secondary | ICD-10-CM | POA: Diagnosis not present

## 2020-10-31 DIAGNOSIS — K3189 Other diseases of stomach and duodenum: Secondary | ICD-10-CM | POA: Diagnosis not present

## 2020-10-31 HISTORY — PX: COLONOSCOPY WITH PROPOFOL: SHX5780

## 2020-10-31 HISTORY — PX: ESOPHAGOGASTRODUODENOSCOPY (EGD) WITH PROPOFOL: SHX5813

## 2020-10-31 SURGERY — COLONOSCOPY WITH PROPOFOL
Anesthesia: General

## 2020-10-31 MED ORDER — LIDOCAINE 2% (20 MG/ML) 5 ML SYRINGE
INTRAMUSCULAR | Status: DC | PRN
  Administered 2020-10-31: 100 mg via INTRAVENOUS

## 2020-10-31 MED ORDER — PROPOFOL 500 MG/50ML IV EMUL
INTRAVENOUS | Status: DC | PRN
  Administered 2020-10-31: 200 ug/kg/min via INTRAVENOUS

## 2020-10-31 MED ORDER — PROPOFOL 10 MG/ML IV BOLUS
INTRAVENOUS | Status: DC | PRN
  Administered 2020-10-31: 100 mg via INTRAVENOUS
  Administered 2020-10-31: 50 mg via INTRAVENOUS

## 2020-10-31 MED ORDER — SODIUM CHLORIDE 0.9 % IV SOLN: INTRAVENOUS | Status: DC

## 2020-10-31 MED ORDER — GLYCOPYRROLATE 0.2 MG/ML IJ SOLN
INTRAMUSCULAR | Status: DC | PRN
  Administered 2020-10-31: .2 mg via INTRAVENOUS

## 2020-10-31 MED ORDER — PROPOFOL 500 MG/50ML IV EMUL
INTRAVENOUS | Status: AC
  Filled 2020-10-31: qty 50

## 2020-10-31 NOTE — Op Note (Signed)
[facility] Gastroenterology Patient Name: Holly Middleton Procedure Date: 10/31/2020 10:12 AM MRN: [id number] Account #: [id number] Date of Birth: [birth year] Admit Type: Outpatient Age: 34 Room: [facility] ENDO ROOM 4 Gender: Female Note Status: Finalized Procedure:             Colonoscopy Indications:           This is the patient's first colonoscopy, Chronic                         diarrhea Providers:             [name] MD, MD Medicines:             General Anesthesia Complications:         No immediate complications. Estimated blood loss: None. Procedure:             Pre-Anesthesia Assessment:                        - Prior to the procedure, a History and Physical was                         performed, and patient medications and allergies were                         reviewed. The patient is competent. The risks and                         benefits of the procedure and the sedation options and                         risks were discussed with the patient. All questions                         were answered and informed consent was obtained.                         Patient identification and proposed procedure were                         verified by the physician, the nurse, the                         anesthesiologist, the anesthetist and the technician                         in the pre-procedure area in the procedure room in the                         endoscopy suite. Mental Status Examination: alert and                         oriented. Airway Examination: normal oropharyngeal                         airway and neck mobility. Respiratory Examination:                         clear to auscultation. CV Examination: normal.  Prophylactic Antibiotics: The patient does not require                         prophylactic antibiotics. Prior Anticoagulants: The                         patient has taken no previous anticoagulant or                          antiplatelet agents. ASA Grade Assessment: III - A                         patient with severe systemic disease. After reviewing                         the risks and benefits, the patient was deemed in                         satisfactory condition to undergo the procedure. The                         anesthesia plan was to use general anesthesia.                         Immediately prior to administration of medications,                         the patient was re-assessed for adequacy to receive                         sedatives. The heart rate, respiratory rate, oxygen                         saturations, blood pressure, adequacy of pulmonary                         ventilation, and response to care were monitored                         throughout the procedure. The physical status of the                         patient was re-assessed after the procedure.                        After obtaining informed consent, the colonoscope was                         passed under direct vision. Throughout the procedure,                         the patient's blood pressure, pulse, and oxygen                         saturations were monitored continuously. The                         Colonoscope was introduced through the anus and  advanced to the 10 cm into the ileum. The colonoscopy                         was performed without difficulty. The patient                         tolerated the procedure well. The quality of the bowel                         preparation was evaluated using the BBPS [facility] Bowel                         Preparation Scale) with scores of: Right Colon = 3,                         Transverse Colon = 3 and Left Colon = 3 (entire mucosa                         seen well with no residual staining, small fragments                         of stool or opaque liquid). The total BBPS score                         equals 9. Findings:      The  perianal and digital rectal examinations were normal. Pertinent       negatives include normal sphincter tone and no palpable rectal lesions.      The terminal ileum appeared normal.      The entire examined colon appeared normal. Biopsies were taken with a       cold forceps for histology.      Non-bleeding external hemorrhoids were found during retroflexion. The       hemorrhoids were small. Impression:            - The examined portion of the ileum was normal.                        - The entire examined colon is normal. Biopsied.                        - Non-bleeding external hemorrhoids. Recommendation:        - Discharge patient to home (with parent).                        - Resume previous diet today.                        - Continue present medications.                        - Await pathology results.                        - Return to my office as previously scheduled. Procedure Code(s):     --- Professional ---                        [phone], Colonoscopy, flexible; with biopsy, single or  multiple Diagnosis Code(s):     --- Professional ---                        K64.4, Residual hemorrhoidal skin tags                        K52.9, Noninfective gastroenteritis and colitis,                         unspecified CPT copyright 2019 American Medical Association. All rights reserved. The codes documented in this report are preliminary and upon coder review may  be revised to meet current compliance requirements. Dr. Libby Maw [name] MD, MD 10/31/2020 10:56:50 AM This report has been signed electronically. Number of Addenda: 0 Note Initiated On: 10/31/2020 10:12 AM Scope Withdrawal Time: 0 hours 8 minutes 16 seconds  Total Procedure Duration: 0 hours 10 minutes 41 seconds  Estimated Blood Loss:  Estimated blood loss: none.      [facility]

## 2020-10-31 NOTE — Anesthesia Postprocedure Evaluation (Signed)
Anesthesia Post Note  Patient: BARBIE CROSTON  Procedure(s) Performed: COLONOSCOPY WITH PROPOFOL (N/A ) ESOPHAGOGASTRODUODENOSCOPY (EGD) WITH PROPOFOL (N/A )  Patient location during evaluation: Endoscopy Anesthesia Type: General Level of consciousness: awake and alert Pain management: pain level controlled Vital Signs Assessment: post-procedure vital signs reviewed and stable Respiratory status: spontaneous breathing, nonlabored ventilation, respiratory function stable and patient connected to nasal cannula oxygen Cardiovascular status: blood pressure returned to baseline and stable Postop Assessment: no apparent nausea or vomiting Anesthetic complications: no   No complications documented.   Last Vitals:  Vitals:   10/31/20 1109 10/31/20 1119  BP: 101/77 98/74  Pulse:    Resp:    Temp:    SpO2:      Last Pain:  Vitals:   10/31/20 1119  TempSrc:   PainSc: 0-No pain                 Cleda Mccreedy Doaa Kendzierski

## 2020-10-31 NOTE — Transfer of Care (Signed)
Immediate Anesthesia Transfer of Care Note  Patient: Holly Middleton  Procedure(s) Performed: COLONOSCOPY WITH PROPOFOL (N/A ) ESOPHAGOGASTRODUODENOSCOPY (EGD) WITH PROPOFOL (N/A )  Patient Location: Endoscopy Unit  Anesthesia Type:General  Level of Consciousness: sedated  Airway & Oxygen Therapy: Patient Spontanous Breathing  Post-op Assessment: Post -op Vital signs reviewed and stable  Post vital signs: stable  Last Vitals:  Vitals Value Taken Time  BP 92/56 10/31/20 1100  Temp 36.7 C 10/31/20 1059  Pulse 95 10/31/20 1100  Resp 22 10/31/20 1100  SpO2 98 % 10/31/20 1100  Vitals shown include unvalidated device data.  Last Pain:  Vitals:   10/31/20 1059  TempSrc: Temporal  PainSc: Asleep         Complications: No complications documented.

## 2020-10-31 NOTE — Op Note (Signed)
[facility] Gastroenterology Patient Name: Holly Middleton Procedure Date: 10/31/2020 10:20 AM MRN: [id number] Account #: [id number] Date of Birth: 05/07/1986 Admit Type: Outpatient Age: 34 Room: [facility] ENDO ROOM 4 Gender: Female Note Status: Finalized Procedure:             Upper GI endoscopy Indications:           Follow-up of gastro-esophageal reflux disease,                         Esophageal reflux symptoms that persist despite                         appropriate therapy, Diarrhea Providers:             [name] MD, MD Medicines:             General Anesthesia Complications:         No immediate complications. Estimated blood loss: None. Procedure:             Pre-Anesthesia Assessment:                        - Prior to the procedure, a History and Physical was                         performed, and patient medications and allergies were                         reviewed. The patient is competent. The risks and                         benefits of the procedure and the sedation options and                         risks were discussed with the patient. All questions                         were answered and informed consent was obtained.                         Patient identification and proposed procedure were                         verified by the physician, the nurse, the                         anesthesiologist, the anesthetist and the technician                         in the pre-procedure area in the procedure room in the                         endoscopy suite. Mental Status Examination: alert and                         oriented. Airway Examination: normal oropharyngeal                         airway and neck mobility. Respiratory Examination:  clear to auscultation. CV Examination: normal.                         Prophylactic Antibiotics: The patient does not require                         prophylactic antibiotics. Prior  Anticoagulants: The                         patient has taken no previous anticoagulant or                         antiplatelet agents. ASA Grade Assessment: III - A                         patient with severe systemic disease. After reviewing                         the risks and benefits, the patient was deemed in                         satisfactory condition to undergo the procedure. The                         anesthesia plan was to use general anesthesia.                         Immediately prior to administration of medications,                         the patient was re-assessed for adequacy to receive                         sedatives. The heart rate, respiratory rate, oxygen                         saturations, blood pressure, adequacy of pulmonary                         ventilation, and response to care were monitored                         throughout the procedure. The physical status of the                         patient was re-assessed after the procedure.                        After obtaining informed consent, the endoscope was                         passed under direct vision. Throughout the procedure,                         the patient's blood pressure, pulse, and oxygen                         saturations were monitored continuously. The Endoscope  was introduced through the mouth, and advanced to the                         second part of duodenum. The upper GI endoscopy was                         accomplished without difficulty. The patient tolerated                         the procedure well. Findings:      The examined duodenum was normal. Biopsies for histology were taken with       a cold forceps for evaluation of celiac disease.      Diffuse mildly erythematous mucosa without bleeding was found in the       gastric body. Biopsies were taken with a cold forceps for Helicobacter       pylori testing.      The incisura and gastric antrum  were normal. Biopsies were taken with a       cold forceps for Helicobacter pylori testing.      The cardia and gastric fundus were normal on retroflexion.      Esophagogastric landmarks were identified: the gastroesophageal junction       was found at 36 cm from the incisors.      The gastroesophageal junction and examined esophagus were normal.       Biopsies were taken with a cold forceps for histology.      A small post PEG scar was found on the greater curvature of the gastric       body. The scar tissue was healthy in appearance. Impression:            - Normal examined duodenum. Biopsied.                        - Erythematous mucosa in the gastric body. Biopsied.                        - Normal incisura and antrum. Biopsied.                        - Esophagogastric landmarks identified.                        - Normal gastroesophageal junction and esophagus.                         Biopsied. Recommendation:        - Await pathology results.                        - Proceed with colonoscopy as scheduled                        See colonoscopy report Procedure Code(s):     --- Professional ---                        [phone], Esophagogastroduodenoscopy, flexible,                         transoral; with biopsy, single or multiple Diagnosis Code(s):     --- Professional ---  K31.89, Other diseases of stomach and duodenum                        K21.9, Gastro-esophageal reflux disease without                         esophagitis                        R19.7, Diarrhea, unspecified CPT copyright 2019 American Medical Association. All rights reserved. The codes documented in this report are preliminary and upon coder review may  be revised to meet current compliance requirements. Dr. Libby Maw [name] MD, MD 10/31/2020 10:41:54 AM This report has been signed electronically. Number of Addenda: 0 Note Initiated On: 10/31/2020 10:20 AM Estimated Blood Loss:   Estimated blood loss: none.      [facility]
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
## 2020-11-01 ENCOUNTER — Encounter: Payer: Self-pay | Admitting: Gastroenterology

## 2020-11-02 LAB — SURGICAL PATHOLOGY

## 2020-11-05 ENCOUNTER — Telehealth: Payer: Self-pay

## 2020-11-05 DIAGNOSIS — K2940 Chronic atrophic gastritis without bleeding: Secondary | ICD-10-CM

## 2020-11-05 DIAGNOSIS — K529 Noninfective gastroenteritis and colitis, unspecified: Secondary | ICD-10-CM

## 2020-11-05 DIAGNOSIS — K219 Gastro-esophageal reflux disease without esophagitis: Secondary | ICD-10-CM

## 2020-11-05 DIAGNOSIS — K625 Hemorrhage of anus and rectum: Secondary | ICD-10-CM

## 2020-11-05 NOTE — Telephone Encounter (Signed)
-----   Message from [name], MD sent at 11/02/2020  1:45 PM EDT ----- Pathology results came back as atrophic gastritis which is a special type of inflammation in the stomach.  There is no bacterial infection in her stomach.  Recommend further lab studies  Intrinsic factor antibodiesParietal cell antibodiesIron panel, B12 and folate panel H. pylori IgG Fasting serum gastrin levels  I will see her for follow-up as scheduled  Holly Middleton

## 2020-11-05 NOTE — Telephone Encounter (Signed)
Patient mom verbalized understanding. Informed patient mom one of the test she had to be fasting. She states they will come Wednesday morning to have lab work

## 2020-11-07 DIAGNOSIS — K625 Hemorrhage of anus and rectum: Secondary | ICD-10-CM | POA: Diagnosis not present

## 2020-11-07 DIAGNOSIS — K219 Gastro-esophageal reflux disease without esophagitis: Secondary | ICD-10-CM | POA: Diagnosis not present

## 2020-11-07 DIAGNOSIS — K529 Noninfective gastroenteritis and colitis, unspecified: Secondary | ICD-10-CM | POA: Diagnosis not present

## 2020-11-07 DIAGNOSIS — K2940 Chronic atrophic gastritis without bleeding: Secondary | ICD-10-CM | POA: Diagnosis not present

## 2020-11-09 LAB — H. PYLORI ANTIBODY, IGG: H. pylori, IgG AbS: 0.39 Index Value (ref 0.00–0.79)

## 2020-11-09 LAB — B12 AND FOLATE PANEL
Folate: 10.3 ng/mL (ref >3.0)
Vitamin B-12: 328 pg/mL (ref 232–1245)

## 2020-11-09 LAB — IRON,TIBC AND FERRITIN PANEL
Ferritin: 14 ng/mL — ABNORMAL LOW (ref 15–150)
Iron Saturation: 27 % (ref 15–55)
Iron: 89 ug/dL (ref 27–159)
Total Iron Binding Capacity: 329 ug/dL (ref 250–450)
UIBC: 240 ug/dL (ref 131–425)

## 2020-11-09 LAB — GASTRIN: Gastrin: 657 pg/mL — ABNORMAL HIGH (ref 0–115)

## 2020-11-09 LAB — INTRINSIC FACTOR ANTIBODIES: Intrinsic Factor Abs, Serum: 27.6 AU/mL — ABNORMAL HIGH (ref 0.0–1.1)

## 2020-11-12 ENCOUNTER — Telehealth: Payer: Self-pay

## 2020-11-12 MED ORDER — FUSION PLUS PO CAPS: 1.0000 | ORAL_CAPSULE | Freq: Every day | ORAL | 2 refills | Status: DC

## 2020-11-12 NOTE — Telephone Encounter (Signed)
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  Recommend trial of oral iron replacement due to severe iron deficiency She can try fusion plus 1 pill daily.  If she is not responding, recommend iron infusion  Rohini Vanga

## 2020-11-12 NOTE — Telephone Encounter (Signed)
Patient mom verbalized understanding of results. She said call in medication to [facility]. Sent medication to the pharmacy

## 2020-11-21 ENCOUNTER — Ambulatory Visit: Payer: Self-pay | Admitting: *Deleted

## 2020-11-21 NOTE — Telephone Encounter (Signed)
Her mother "Dia Sitter" who is on Natalia's DPR called in regarding Zahriyah having a nosebleed this morning.  Per Mitchell Epling is like a child so she takes care of Meghana.  It only bled "4 drops of bright red blood".   No further bleeding.   "She is resting now".  Dia Sitter was talking fast and c/o being anxious.  See notes below.   I let her talk and reassured her for the anxiety.  She thanked me for listening to her and for my help which was to keep an eye on Keagan and if she develops a nosebleed they can't get stopped then to go to the ED. Dia Sitter was agreeable to this plan and thanked me again for listening to her.    Reason for Disposition . [1] Mild-moderate nosebleed AND [2] bleeding stopped now  Answer Assessment - Initial Assessment Questions 1. AMOUNT OF BLEEDING: "How bad is the bleeding?" "How much blood was lost?" "Has the bleeding stopped?"   - MILD: needed a couple tissues   - MODERATE: needed many tissues   - SEVERE: large blood clots, soaked many tissues, lasted more than 30 minutes      "It's taken care of now" per [place] the mother.  It bled 4 drops of blood and then nausea.   Now she's resting.  "I've been in the psych ward for stress due to my daughter Colleen".   "I'm a mess so I'm going to take my Klonopin".  "I'm also taking care of an elderly lady as well as my daughter".   2. ONSET: "When did the nosebleed start?"      This morning.   Just 4 drops of bright red blood then it stopped.   3. FREQUENCY: "How many nosebleeds have you had in the last 24 hours?"      *No Answer* 4. RECURRENT SYMPTOMS: "Have there been other recent nosebleeds?" If Yes, ask: "How long did it take you to stop the bleeding?" "What worked best?"      *No Answer* 5. CAUSE: "What do you think caused this nosebleed?"     Maybe the weather change or the heat drying her nose out per mother Bella 6. LOCAL FACTORS: "Do you have any cold symptoms?", "Have you been rubbing or picking at your nose?"     *No Answer* 7.  SYSTEMIC FACTORS: "Do you have high blood pressure or any bleeding problems?"     *No Answer* 8. BLOOD THINNERS: "Do you take any blood thinners?" (e.g., coumadin, heparin, aspirin, Plavix)     *No Answer* 9. OTHER SYMPTOMS: "Do you have any other symptoms?" (e.g., lightheadedness)     *No Answer* 10. PREGNANCY: "Is there any chance you are pregnant?" "When was your last menstrual period?"       *No Answer*  Protocols used: NOSEBLEED-A-AH

## 2020-12-27 DIAGNOSIS — E890 Postprocedural hypothyroidism: Secondary | ICD-10-CM | POA: Diagnosis not present

## 2021-01-02 ENCOUNTER — Ambulatory Visit (INDEPENDENT_AMBULATORY_CARE_PROVIDER_SITE_OTHER): Payer: Medicaid Other | Admitting: Gastroenterology

## 2021-01-02 ENCOUNTER — Encounter: Payer: Self-pay | Admitting: Gastroenterology

## 2021-01-02 VITALS — BP 124/81 | HR 73 | Temp 98.0°F | Ht 63.0 in | Wt 148.4 lb

## 2021-01-02 DIAGNOSIS — E890 Postprocedural hypothyroidism: Secondary | ICD-10-CM | POA: Diagnosis not present

## 2021-01-02 DIAGNOSIS — E538 Deficiency of other specified B group vitamins: Secondary | ICD-10-CM

## 2021-01-02 DIAGNOSIS — D509 Iron deficiency anemia, unspecified: Secondary | ICD-10-CM | POA: Diagnosis not present

## 2021-01-02 DIAGNOSIS — R197 Diarrhea, unspecified: Secondary | ICD-10-CM | POA: Diagnosis not present

## 2021-01-02 DIAGNOSIS — K640 First degree hemorrhoids: Secondary | ICD-10-CM

## 2021-01-02 MED ORDER — HYDROCORTISONE (PERIANAL) 2.5 % EX CREA: 1.0000 "application " | TOPICAL_CREAM | Freq: Two times a day (BID) | CUTANEOUS | 1 refills | Status: DC
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
## 2021-01-03 LAB — IRON,TIBC AND FERRITIN PANEL
Ferritin: 24 ng/mL (ref 15–150)
Iron Saturation: 31 % (ref 15–55)
Iron: 81 ug/dL (ref 27–159)
Total Iron Binding Capacity: 263 ug/dL (ref 250–450)
UIBC: 182 ug/dL (ref 131–425)

## 2021-01-03 LAB — B12 AND FOLATE PANEL
Folate: 15.1 ng/mL (ref >3.0)
Vitamin B-12: 295 pg/mL (ref 232–1245)

## 2021-04-10 DIAGNOSIS — E890 Postprocedural hypothyroidism: Secondary | ICD-10-CM | POA: Diagnosis not present

## 2021-04-17 ENCOUNTER — Ambulatory Visit: Payer: Medicaid Other | Admitting: Gastroenterology

## 2021-05-22 DIAGNOSIS — G479 Sleep disorder, unspecified: Secondary | ICD-10-CM | POA: Diagnosis not present

## 2021-05-22 DIAGNOSIS — G4489 Other headache syndrome: Secondary | ICD-10-CM | POA: Diagnosis not present

## 2021-05-22 DIAGNOSIS — F411 Generalized anxiety disorder: Secondary | ICD-10-CM | POA: Diagnosis not present

## 2021-05-22 DIAGNOSIS — R569 Unspecified convulsions: Secondary | ICD-10-CM | POA: Diagnosis not present

## 2021-06-13 DIAGNOSIS — H53461 Homonymous bilateral field defects, right side: Secondary | ICD-10-CM | POA: Diagnosis not present

## 2021-07-15 ENCOUNTER — Other Ambulatory Visit: Payer: Self-pay

## 2021-07-15 ENCOUNTER — Encounter: Payer: Self-pay | Admitting: Urology

## 2021-07-15 ENCOUNTER — Ambulatory Visit (INDEPENDENT_AMBULATORY_CARE_PROVIDER_SITE_OTHER): Payer: Medicaid Other | Admitting: Urology

## 2021-07-15 VITALS — BP 120/76 | HR 60 | Ht 63.0 in | Wt 141.0 lb

## 2021-07-15 DIAGNOSIS — R351 Nocturia: Secondary | ICD-10-CM | POA: Diagnosis not present

## 2021-07-15 DIAGNOSIS — R350 Frequency of micturition: Secondary | ICD-10-CM | POA: Diagnosis not present

## 2021-07-15 LAB — MICROSCOPIC EXAMINATION: RBC, Urine: NONE SEEN /hpf (ref 0–2)

## 2021-07-15 LAB — URINALYSIS, COMPLETE
Bilirubin, UA: NEGATIVE
Glucose, UA: NEGATIVE
Ketones, UA: NEGATIVE
Leukocytes,UA: NEGATIVE
Nitrite, UA: NEGATIVE
Protein,UA: NEGATIVE
RBC, UA: NEGATIVE
Specific Gravity, UA: 1.03 (ref 1.005–1.030)
Urobilinogen, Ur: 0.2 mg/dL (ref 0.2–1.0)
pH, UA: 5.5 (ref 5.0–7.5)

## 2021-07-15 MED ORDER — MIRABEGRON ER 50 MG PO TB24: 50.0000 mg | ORAL_TABLET | Freq: Every day | ORAL | 11 refills | Status: DC
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
## 2021-08-01 ENCOUNTER — Ambulatory Visit: Payer: Medicaid Other | Admitting: Family Medicine

## 2021-08-01 ENCOUNTER — Encounter: Payer: Self-pay | Admitting: Family Medicine

## 2021-08-01 VITALS — BP 106/76 | HR 67 | Temp 98.5°F | Wt 145.0 lb

## 2021-08-01 DIAGNOSIS — S069X9S Unspecified intracranial injury with loss of consciousness of unspecified duration, sequela: Secondary | ICD-10-CM

## 2021-08-01 DIAGNOSIS — K219 Gastro-esophageal reflux disease without esophagitis: Secondary | ICD-10-CM | POA: Diagnosis not present

## 2021-08-01 DIAGNOSIS — R569 Unspecified convulsions: Secondary | ICD-10-CM | POA: Diagnosis not present

## 2021-08-01 DIAGNOSIS — E039 Hypothyroidism, unspecified: Secondary | ICD-10-CM | POA: Diagnosis not present

## 2021-08-01 NOTE — Assessment & Plan Note (Signed)
Chronic and stable Continue Synthroid Continue following w/ Dr. Tedd Sias
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
## 2021-08-01 NOTE — Assessment & Plan Note (Signed)
Stable and well-controlled Continue Keppra & Depakote Continue following with Dr. Malvin Johns

## 2021-08-01 NOTE — Assessment & Plan Note (Signed)
Stable & well-controlled Continue Pepcid & TUMS prn for reflux

## 2021-08-26 ENCOUNTER — Ambulatory Visit: Payer: Self-pay | Admitting: Urology

## 2021-09-24 ENCOUNTER — Ambulatory Visit (INDEPENDENT_AMBULATORY_CARE_PROVIDER_SITE_OTHER): Payer: Medicaid Other | Admitting: Obstetrics and Gynecology

## 2021-09-24 ENCOUNTER — Other Ambulatory Visit: Payer: Self-pay

## 2021-09-24 ENCOUNTER — Encounter: Payer: Self-pay | Admitting: Obstetrics and Gynecology

## 2021-09-24 VITALS — BP 112/68 | HR 69 | Ht 63.0 in | Wt 143.0 lb

## 2021-09-24 DIAGNOSIS — Z1239 Encounter for other screening for malignant neoplasm of breast: Secondary | ICD-10-CM | POA: Diagnosis not present

## 2021-09-24 DIAGNOSIS — Z01419 Encounter for gynecological examination (general) (routine) without abnormal findings: Secondary | ICD-10-CM
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
## 2021-10-01 ENCOUNTER — Ambulatory Visit: Payer: Medicaid Other | Admitting: Family Medicine

## 2021-12-16 ENCOUNTER — Encounter: Payer: Self-pay | Admitting: Adult Health

## 2021-12-16 ENCOUNTER — Ambulatory Visit (INDEPENDENT_AMBULATORY_CARE_PROVIDER_SITE_OTHER): Payer: Medicaid Other | Admitting: Adult Health

## 2021-12-16 VITALS — BP 114/68 | HR 75 | Ht 62.99 in | Wt 140.0 lb

## 2021-12-16 DIAGNOSIS — E890 Postprocedural hypothyroidism: Secondary | ICD-10-CM

## 2021-12-16 DIAGNOSIS — E559 Vitamin D deficiency, unspecified: Secondary | ICD-10-CM | POA: Diagnosis not present

## 2021-12-16 DIAGNOSIS — Z79899 Other long term (current) drug therapy: Secondary | ICD-10-CM

## 2021-12-16 DIAGNOSIS — E039 Hypothyroidism, unspecified: Secondary | ICD-10-CM

## 2021-12-16 DIAGNOSIS — Z90710 Acquired absence of both cervix and uterus: Secondary | ICD-10-CM

## 2021-12-16 DIAGNOSIS — Z9089 Acquired absence of other organs: Secondary | ICD-10-CM

## 2021-12-16 HISTORY — DX: Postprocedural hypothyroidism: E89.0

## 2021-12-16 HISTORY — DX: Acquired absence of other organs: Z90.89

## 2021-12-16 MED ORDER — LEVOTHYROXINE SODIUM 112 MCG PO TABS: 112.0000 ug | ORAL_TABLET | Freq: Every day | ORAL | 0 refills | Status: AC
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
## 2021-12-16 NOTE — Patient Instructions (Signed)

## 2022-01-09 ENCOUNTER — Other Ambulatory Visit: Payer: Self-pay

## 2022-01-09 ENCOUNTER — Other Ambulatory Visit (INDEPENDENT_AMBULATORY_CARE_PROVIDER_SITE_OTHER): Payer: Medicaid Other

## 2022-01-09 DIAGNOSIS — E559 Vitamin D deficiency, unspecified: Secondary | ICD-10-CM | POA: Diagnosis not present

## 2022-01-09 DIAGNOSIS — Z79899 Other long term (current) drug therapy: Secondary | ICD-10-CM

## 2022-01-09 DIAGNOSIS — E039 Hypothyroidism, unspecified: Secondary | ICD-10-CM | POA: Diagnosis not present

## 2022-01-09 LAB — CBC WITH DIFFERENTIAL/PLATELET
Basophils Absolute: 0 10*3/uL (ref 0.0–0.1)
Basophils Relative: 0.4 % (ref 0.0–3.0)
Eosinophils Absolute: 0 10*3/uL (ref 0.0–0.7)
Eosinophils Relative: 0.3 % (ref 0.0–5.0)
HCT: 45.5 % (ref 36.0–46.0)
Hemoglobin: 15.5 g/dL — ABNORMAL HIGH (ref 12.0–15.0)
Lymphocytes Relative: 35.2 % (ref 12.0–46.0)
Lymphs Abs: 1.7 10*3/uL (ref 0.7–4.0)
MCHC: 34.1 g/dL (ref 30.0–36.0)
MCV: 98 fl (ref 78.0–100.0)
Monocytes Absolute: 0.3 10*3/uL (ref 0.1–1.0)
Monocytes Relative: 7 % (ref 3.0–12.0)
Neutro Abs: 2.8 10*3/uL (ref 1.4–7.7)
Neutrophils Relative %: 57.1 % (ref 43.0–77.0)
Platelets: 203 10*3/uL (ref 150.0–400.0)
RBC: 4.64 Mil/uL (ref 3.87–5.11)
RDW: 12.7 % (ref 11.5–15.5)
WBC: 5 10*3/uL (ref 4.0–10.5)

## 2022-01-09 LAB — LIPID PANEL
Cholesterol: 159 mg/dL (ref 0–200)
HDL: 47 mg/dL (ref >39.00)
LDL Cholesterol: 91 mg/dL (ref 0–99)
NonHDL: 112
Total CHOL/HDL Ratio: 3
Triglycerides: 103 mg/dL (ref 0.0–149.0)
VLDL: 20.6 mg/dL (ref 0.0–40.0)

## 2022-01-09 LAB — COMPREHENSIVE METABOLIC PANEL
ALT: 11 U/L (ref 0–35)
AST: 12 U/L (ref 0–37)
Albumin: 4.6 g/dL (ref 3.5–5.2)
Alkaline Phosphatase: 39 U/L (ref 39–117)
BUN: 10 mg/dL (ref 6–23)
CO2: 28 mEq/L (ref 19–32)
Calcium: 9.5 mg/dL (ref 8.4–10.5)
Chloride: 101 mEq/L (ref 96–112)
Creatinine, Ser: 0.64 mg/dL (ref 0.40–1.20)
GFR: 113.93 mL/min (ref >60.00)
Glucose, Bld: 78 mg/dL (ref 70–99)
Potassium: 4.5 mEq/L (ref 3.5–5.1)
Sodium: 135 mEq/L (ref 135–145)
Total Bilirubin: 0.5 mg/dL (ref 0.2–1.2)
Total Protein: 7.5 g/dL (ref 6.0–8.3)

## 2022-01-09 LAB — VITAMIN D 25 HYDROXY (VIT D DEFICIENCY, FRACTURES): VITD: 34.2 ng/mL (ref 30.00–100.00)

## 2022-01-09 LAB — TSH: TSH: 1.35 u[IU]/mL (ref 0.35–5.50)

## 2022-01-10 LAB — VALPROIC ACID LEVEL: Valproic Acid Lvl: 100.5 mg/L — ABNORMAL HIGH (ref 50.0–100.0)

## 2022-01-10 LAB — LEVETIRACETAM LEVEL: Levetiracetam Lvl: 28.6 ug/mL (ref 10.0–40.0)

## 2022-01-11 NOTE — Progress Notes (Signed)
Valproic acid level is over the level of normal follow up with prescriber of this medication.  CBC ok slightly elevated hemoglobin if on iron would decrease or stop. TSH for thyroid is within normal limits.  CMP and CMP, vitamin D and lipid panel within normal limits.  Levetiracetam level with normal limits.

## 2022-03-10 ENCOUNTER — Telehealth: Payer: Self-pay

## 2022-03-10 ENCOUNTER — Telehealth (INDEPENDENT_AMBULATORY_CARE_PROVIDER_SITE_OTHER): Payer: Medicaid Other | Admitting: Adult Health

## 2022-03-10 ENCOUNTER — Ambulatory Visit
Admission: RE | Admit: 2022-03-10 | Discharge: 2022-03-10 | Disposition: A | Discharge status: Home / Self Care | Payer: Medicaid Other | Source: Ambulatory Visit | Attending: Adult Health | Admitting: Adult Health

## 2022-03-10 ENCOUNTER — Other Ambulatory Visit
Admission: RE | Admit: 2022-03-10 | Discharge: 2022-03-10 | Disposition: A | Discharge status: Home / Self Care | Payer: Medicaid Other | Source: Home / Self Care | Attending: Adult Health | Admitting: Adult Health

## 2022-03-10 ENCOUNTER — Encounter: Payer: Self-pay | Admitting: Adult Health

## 2022-03-10 ENCOUNTER — Ambulatory Visit
Admission: RE | Admit: 2022-03-10 | Discharge: 2022-03-10 | Disposition: A | Discharge status: Home / Self Care | Payer: Medicaid Other | Attending: Adult Health | Admitting: Adult Health

## 2022-03-10 DIAGNOSIS — Z20822 Contact with and (suspected) exposure to covid-19: Secondary | ICD-10-CM | POA: Diagnosis not present

## 2022-03-10 DIAGNOSIS — M542 Cervicalgia: Secondary | ICD-10-CM

## 2022-03-10 DIAGNOSIS — R8290 Unspecified abnormal findings in urine: Secondary | ICD-10-CM

## 2022-03-10 LAB — URINALYSIS, ROUTINE W REFLEX MICROSCOPIC
Bilirubin Urine: NEGATIVE
Glucose, UA: NEGATIVE mg/dL
Hgb urine dipstick: NEGATIVE
Ketones, ur: NEGATIVE mg/dL
Leukocytes,Ua: NEGATIVE
Nitrite: NEGATIVE
Protein, ur: NEGATIVE mg/dL
Specific Gravity, Urine: 1.011 (ref 1.005–1.030)
pH: 5 (ref 5.0–8.0)

## 2022-03-10 LAB — CBC WITH DIFFERENTIAL/PLATELET
Abs Immature Granulocytes: 0.05 10*3/uL (ref 0.00–0.07)
Basophils Absolute: 0 10*3/uL (ref 0.0–0.1)
Basophils Relative: 1 %
Eosinophils Absolute: 0.1 10*3/uL (ref 0.0–0.5)
Eosinophils Relative: 1 %
HCT: 40.7 % (ref 36.0–46.0)
Hemoglobin: 14 g/dL (ref 12.0–15.0)
Immature Granulocytes: 1 %
Lymphocytes Relative: 27 %
Lymphs Abs: 2.2 10*3/uL (ref 0.7–4.0)
MCH: 32.3 pg (ref 26.0–34.0)
MCHC: 34.4 g/dL (ref 30.0–36.0)
MCV: 94 fL (ref 80.0–100.0)
Monocytes Absolute: 0.5 10*3/uL (ref 0.1–1.0)
Monocytes Relative: 7 %
Neutro Abs: 5.2 10*3/uL (ref 1.7–7.7)
Neutrophils Relative %: 63 %
Platelets: 198 10*3/uL (ref 150–400)
RBC: 4.33 MIL/uL (ref 3.87–5.11)
RDW: 11.4 % — ABNORMAL LOW (ref 11.5–15.5)
WBC: 8.1 10*3/uL (ref 4.0–10.5)
nRBC: 0 % (ref 0.0–0.2)

## 2022-03-10 LAB — COMPREHENSIVE METABOLIC PANEL
ALT: 14 U/L (ref 0–44)
AST: 17 U/L (ref 15–41)
Albumin: 4 g/dL (ref 3.5–5.0)
Alkaline Phosphatase: 41 U/L (ref 38–126)
Anion gap: 11 (ref 5–15)
BUN: 14 mg/dL (ref 6–20)
CO2: 25 mmol/L (ref 22–32)
Calcium: 9.3 mg/dL (ref 8.9–10.3)
Chloride: 99 mmol/L (ref 98–111)
Creatinine, Ser: 0.52 mg/dL (ref 0.44–1.00)
GFR, Estimated: >60 mL/min (ref >60)
Glucose, Bld: 89 mg/dL (ref 70–99)
Potassium: 3.7 mmol/L (ref 3.5–5.1)
Sodium: 135 mmol/L (ref 135–145)
Total Bilirubin: 0.4 mg/dL (ref 0.3–1.2)
Total Protein: 7.5 g/dL (ref 6.5–8.1)

## 2022-03-10 NOTE — Telephone Encounter (Signed)
-----   Message from Berniece Pap, FNP sent at 03/10/2022  2:54 PM EDT ----- ?CBC and CMP within normal limits.  ?Urinalysis is within normal and will wait on urine culture.  ? ?

## 2022-03-10 NOTE — Progress Notes (Signed)
Virtual Visit via Video Note ? ?I connected with Holly Middleton on 03/10/22 at 11:30 AM EDT by a video enabled telemedicine application and verified that I am speaking with the correct person using two identifiers. ? ?Location: ?Patient: at home  ?Provider: Provider: Provider's office at  [facility], [place]. ?   ?  ?I discussed the limitations of evaluation and management by telemedicine and the availability of in person appointments. The patient expressed understanding and agreed to proceed. ? ?History of Present Illness: ?Patient is a 36 year old female in no acute distress.  ?She was exposed to covid.  However she denies any symptoms of cough, fever chills nausea vomiting or any other associated symptoms. ?She has had mild neck pain, right side. Hurts moving neck and right shoulder. She has taken tylenol it has helped some.  She denies any injury.  She has had the symptoms for over 1 week.  She denies chest pain or palpitations. ? ?She also complains of an odor to her urine, she like to have a urinalysis performed.  Patient has mild burning with urination.  She denies any concerns for sexually transmitted diseases or need for testing.  Denies any vaginal discharge. ? ?Patient  denies any fever, body aches,chills, rash, chest pain, shortness of breath, nausea, vomiting, or diarrhea.  ?Denies dizziness, lightheadedness, pre syncopal or syncopal episodes.   ?  ?Observations/Objective: ? ? ?Patient is alert and oriented and responsive to questions Engages in conversation with provider. Speaks in full sentences without any pauses without any shortness of breath or distress.   ?Assessment and Plan: ? ?Problem List Items Addressed This Visit   ? ?  ? Other  ? Exposure to COVID-19 virus  ? Neck pain, acute  ? Relevant Orders  ? DG Cervical Spine Complete  ? DG Shoulder Right  ? Abnormal urine odor - Primary  ? Relevant Orders  ? Urine Culture  ? Urinalysis, Routine w reflex microscopic   ? CBC with Differential/Platelet  ? Comprehensive metabolic panel  ?  ?Follow Up Instructions: ?Exposure to COVID without any known symptoms, however recommend the patient monitor for symptoms and return should any symptoms change or worsen. ? ? NSAID of choice per package instructions.  Follow-up in office visit if not improving or any symptoms worsening.  [facility] is also available Monday through Friday patient is aware.  Rest, ice advised. ? ?We will check urine.  If symptoms worsen any time patient should schedule an in office visit.  Patient verbalized understanding.  She has no other concerns at this time. ? ?Advised in person evaluation at anytime is advised if any symptoms do not improve, worsen or change at any given time.  ?Red Flags discussed. The patient was given clear instructions to go to ER or return to medical center if any red flags develop, symptoms do not improve, worsen or new problems develop. They verbalized understanding.  ? ?I discussed the assessment and treatment plan with the patient. The patient was provided an opportunity to ask questions and all were answered. The patient agreed with the plan and demonstrated an understanding of the instructions. ?  ? ?Patient is aware that this primary care provider is leaving the office and last day will be March 20, 2022 and he will need to establish care with a new primary care provider, list is available upfront for patient to help him with establishing care or they can choose a provider of their  choice.Patient verbalized understanding of all instructions given and denies any further questions at this time.   ?The patient was advised to call back or seek an in-person evaluation if the symptoms worsen or if the condition fails to improve as anticipated. ? ? ? ?Jairo Ben, FNP  ?

## 2022-03-10 NOTE — Progress Notes (Signed)
CBC and CMP within normal limits.  ?Urinalysis is within normal and will wait on urine culture.  ?

## 2022-03-11 ENCOUNTER — Telehealth: Payer: Self-pay

## 2022-03-11 NOTE — Telephone Encounter (Signed)
Mychart msg sent

## 2022-03-11 NOTE — Progress Notes (Signed)
Minimal AC joint arthritis, can be seen [facility] if symptoms persist and not resolved with over the counter NSAID or tylenol or if persists at anytime.

## 2022-03-11 NOTE — Progress Notes (Signed)
Cervical x ray is within normal limits. Follow up if any symptoms persist at anytime.

## 2022-03-12 LAB — URINE CULTURE

## 2022-03-12 NOTE — Progress Notes (Signed)
Urine shows multiple species, recommend repeat urine culture, please add lab and schedule, be sure she does know how to get a clean catch urine with wipe.

## 2022-03-13 ENCOUNTER — Ambulatory Visit (INDEPENDENT_AMBULATORY_CARE_PROVIDER_SITE_OTHER): Payer: Medicaid Other | Admitting: Adult Health

## 2022-03-13 ENCOUNTER — Encounter: Payer: Self-pay | Admitting: Adult Health

## 2022-03-13 ENCOUNTER — Other Ambulatory Visit: Payer: Self-pay

## 2022-03-13 VITALS — BP 122/72 | HR 74 | Temp 98.6°F | Ht 63.0 in | Wt 142.8 lb

## 2022-03-13 DIAGNOSIS — R300 Dysuria: Secondary | ICD-10-CM | POA: Diagnosis not present

## 2022-03-13 LAB — POCT URINALYSIS DIPSTICK
Bilirubin, UA: NEGATIVE
Blood, UA: NEGATIVE
Glucose, UA: NEGATIVE
Leukocytes, UA: NEGATIVE
Nitrite, UA: NEGATIVE
Protein, UA: NEGATIVE
Spec Grav, UA: 1.025 (ref 1.010–1.025)
Urobilinogen, UA: 0.2 E.U./dL
pH, UA: 6 (ref 5.0–8.0)

## 2022-03-13 NOTE — Progress Notes (Signed)
Urine okay will send for culture

## 2022-03-13 NOTE — Patient Instructions (Addendum)
Nature conservation officer at [facility] ?Get online care: [email or web address] ?Address: [address] [place], [place] [address] ?Open ? Closes 5?PM ?Confirmed by this business 12 weeks ago ?Phone: [phone] ? ?Health Maintenance, Female ?Adopting a healthy lifestyle and getting preventive care are important in promoting health and wellness. Ask your health care provider about: ?The right schedule for you to have regular tests and exams. ?Things you can do on your own to prevent diseases and keep yourself healthy. ?What should I know about diet, weight, and exercise? ?Eat a healthy diet ? ?Eat a diet that includes plenty of vegetables, fruits, low-fat dairy products, and lean protein. ?Do not eat a lot of foods that are high in solid fats, added sugars, or sodium. ?Maintain a healthy weight ?Body mass index (BMI) is used to identify weight problems. It estimates body fat based on height and weight. Your health care provider can help determine your BMI and help you achieve or maintain a healthy weight. ?Get regular exercise ?Get regular exercise. This is one of the most important things you can do for your health. Most adults should: ?Exercise for at least 150 minutes each week. The exercise should increase your heart rate and make you sweat (moderate-intensity exercise). ?Do strengthening exercises at least twice a week. This is in addition to the moderate-intensity exercise. ?Spend less time sitting. Even light physical activity can be beneficial. ?Watch cholesterol and blood lipids ?Have your blood tested for lipids and cholesterol at 37 years of age, then have this test every 5 years. ?Have your cholesterol levels checked more often if: ?Your lipid or cholesterol levels are high. ?You are older than 37 years of age. ?You are at high risk for heart disease. ?What should I know about cancer screening? ?Depending on your health history and family history, you may need to have cancer screening at various ages. This may  include screening for: ?Breast cancer. ?Cervical cancer. ?Colorectal cancer. ?Skin cancer. ?Lung cancer. ?What should I know about heart disease, diabetes, and high blood pressure? ?Blood pressure and heart disease ?High blood pressure causes heart disease and increases the risk of stroke. This is more likely to develop in people who have high blood pressure readings or are overweight. ?Have your blood pressure checked: ?Every 3-5 years if you are 39-55 years of age. ?Every year if you are 63 years old or older. ?Diabetes ?Have regular diabetes screenings. This checks your fasting blood sugar level. Have the screening done: ?Once every three years after age 49 if you are at a normal weight and have a low risk for diabetes. ?More often and at a younger age if you are overweight or have a high risk for diabetes. ?What should I know about preventing infection? ?Hepatitis B ?If you have a higher risk for hepatitis B, you should be screened for this virus. Talk with your health care provider to find out if you are at risk for hepatitis B infection. ?Hepatitis C ?Testing is recommended for: ?Everyone born from [birth year] through 1966. ?Anyone with known risk factors for hepatitis C. ?Sexually transmitted infections (STIs) ?Get screened for STIs, including gonorrhea and chlamydia, if: ?You are sexually active and are younger than 37 years of age. ?You are older than 37 years of age and your health care provider tells you that you are at risk for this type of infection. ?Your sexual activity has changed since you were last screened, and you are at increased risk for chlamydia or gonorrhea. Ask your health care  provider if you are at risk. ?Ask your health care provider about whether you are at high risk for HIV. Your health care provider may recommend a prescription medicine to help prevent HIV infection. If you choose to take medicine to prevent HIV, you should first get tested for HIV. You should then be tested every 3 months  for as long as you are taking the medicine. ?Pregnancy ?If you are about to stop having your period (premenopausal) and you may become pregnant, seek counseling before you get pregnant. ?Take 400 to 800 micrograms (mcg) of folic acid every day if you become pregnant. ?Ask for birth control (contraception) if you want to prevent pregnancy. ?Osteoporosis and menopause ?Osteoporosis is a disease in which the bones lose minerals and strength with aging. This can result in bone fractures. If you are 36 years old or older, or if you are at risk for osteoporosis and fractures, ask your health care provider if you should: ?Be screened for bone loss. ?Take a calcium or vitamin D supplement to lower your risk of fractures. ?Be given hormone replacement therapy (HRT) to treat symptoms of menopause. ?Follow these instructions at home: ?Alcohol use ?Do not drink alcohol if: ?Your health care provider tells you not to drink. ?You are pregnant, may be pregnant, or are planning to become pregnant. ?If you drink alcohol: ?Limit how much you have to: ?0-1 drink a day. ?Know how much alcohol is in your drink. In the [place] one drink equals one 12 oz bottle of beer (355 mL), one 5 oz glass of wine (148 mL), or one 1? oz glass of hard liquor (44 mL). ?Lifestyle ?Do not use any products that contain nicotine or tobacco. These products include cigarettes, chewing tobacco, and vaping devices, such as e-cigarettes. If you need help quitting, ask your health care provider. ?Do not use street drugs. ?Do not share needles. ?Ask your health care provider for help if you need support or information about quitting drugs. ?General instructions ?Schedule regular health, dental, and eye exams. ?Stay current with your vaccines. ?Tell your health care provider if: ?You often feel depressed. ?You have ever been abused or do not feel safe at home. ?Summary ?Adopting a healthy lifestyle and getting preventive care are important in promoting health and  wellness. ?Follow your health care provider's instructions about healthy diet, exercising, and getting tested or screened for diseases. ?Follow your health care provider's instructions on monitoring your cholesterol and blood pressure. ?This information is not intended to replace advice given to you by your health care provider. Make sure you discuss any questions you have with your health care provider. ?Document Revised: 05/06/2021 Document Reviewed: 05/06/2021 ?Elsevier Patient Education ? 2022 [employer] ? ?
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
## 2022-03-14 LAB — URINE CULTURE
MICRO NUMBER:: 13140201
Result:: NO GROWTH
SPECIMEN QUALITY:: ADEQUATE

## 2022-03-18 ENCOUNTER — Encounter: Payer: Self-pay | Admitting: Adult Health

## 2022-03-18 DIAGNOSIS — R300 Dysuria: Secondary | ICD-10-CM | POA: Insufficient documentation

## 2022-04-10 ENCOUNTER — Encounter: Payer: Self-pay | Admitting: Family

## 2022-04-10 ENCOUNTER — Ambulatory Visit (INDEPENDENT_AMBULATORY_CARE_PROVIDER_SITE_OTHER): Payer: Medicaid Other | Admitting: Family

## 2022-04-10 VITALS — BP 118/68 | HR 62 | Temp 98.5°F | Resp 16 | Ht 63.0 in | Wt 145.5 lb

## 2022-04-10 DIAGNOSIS — R569 Unspecified convulsions: Secondary | ICD-10-CM

## 2022-04-10 DIAGNOSIS — Z5320 Procedure and treatment not carried out because of patient's decision for unspecified reasons: Secondary | ICD-10-CM

## 2022-04-10 DIAGNOSIS — J4520 Mild intermittent asthma, uncomplicated: Secondary | ICD-10-CM | POA: Diagnosis not present

## 2022-04-10 DIAGNOSIS — H53453 Other localized visual field defect, bilateral: Secondary | ICD-10-CM

## 2022-04-10 DIAGNOSIS — L309 Dermatitis, unspecified: Secondary | ICD-10-CM

## 2022-04-10 DIAGNOSIS — Z87820 Personal history of traumatic brain injury: Secondary | ICD-10-CM

## 2022-04-10 DIAGNOSIS — E039 Hypothyroidism, unspecified: Secondary | ICD-10-CM

## 2022-04-10 DIAGNOSIS — H547 Unspecified visual loss: Secondary | ICD-10-CM

## 2022-04-10 DIAGNOSIS — F418 Other specified anxiety disorders: Secondary | ICD-10-CM | POA: Insufficient documentation

## 2022-04-10 DIAGNOSIS — J45909 Unspecified asthma, uncomplicated: Secondary | ICD-10-CM | POA: Insufficient documentation

## 2022-04-10 DIAGNOSIS — G479 Sleep disorder, unspecified: Secondary | ICD-10-CM

## 2022-04-10 DIAGNOSIS — J301 Allergic rhinitis due to pollen: Secondary | ICD-10-CM

## 2022-04-10 MED ORDER — MONTELUKAST SODIUM 10 MG PO TABS: 10.0000 mg | ORAL_TABLET | Freq: Every day | ORAL | 3 refills | Status: DC

## 2022-04-10 MED ORDER — ALBUTEROL SULFATE HFA 108 (90 BASE) MCG/ACT IN AERS: 2.0000 | INHALATION_SPRAY | Freq: Four times a day (QID) | RESPIRATORY_TRACT | 0 refills | Status: DC | PRN

## 2022-04-10 NOTE — Assessment & Plan Note (Signed)
Continue triamcinolone 0.1% ?

## 2022-04-10 NOTE — Assessment & Plan Note (Signed)
No recent episodes ?Continue depakote and keppra with neurology ?Cont f/u with neurology as scheduled ?
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
## 2022-04-10 NOTE — Assessment & Plan Note (Signed)
Albuterol 108 mcg hfa as needed ?Refill singulair 10 mg  ? ?

## 2022-04-10 NOTE — Assessment & Plan Note (Signed)
Reviewed tsh,  ?Continue levothyroxine 112 mcg  ?Continue f/u with endo as scheduled ? ?

## 2022-04-10 NOTE — Assessment & Plan Note (Addendum)
Cont f/u with opthamologist as scheduled ?Chronic ?

## 2022-04-10 NOTE — Patient Instructions (Addendum)
I recommend zyrtec and or xyzal instead of claritin.  ? ?Please work on the following to help with sleep: ? ?-Sleep only long enough to feel rested then get out of bed ?-Go to bed and get up at the same time every day. ?-Do not try to force yourself to sleep. If you can't sleep, get out of bed adn try again later. ?-Have coffee, tea, and other foods that have caffeine only in the morning. ?-Avoid alcohol ?-Keep your bedroom dark, cool, quiet, and free of reminders of work or other things that cause you stress ?-Exercise several days a week, but not right before bed ?-Avoid looking at phones or reading devices ("e-books") that give off light before bed. This can make it harder to fall asleep ? ?Welcome to our clinic, I am happy to have you as my new patient. I am excited to continue on this healthcare journey with you. ? ?Please keep in mind ?Any my chart messages you send have p to a three business day turnaround for a response.  ?Phone calls may have up to a one day business turnaround for a  response.  ? ?If you need a medication refill I recommend you request it through the pharmacy as this is easiest for [place] rather than sending a message and or phone call.  ? ?Due to recent changes in healthcare laws, you may see results of your imaging and/or laboratory studies on MyChart before I have had a chance to review them.  I understand that in some cases there may be results that are confusing or concerning to you. Please understand that not all results are received at the same time and often I may need to interpret multiple results in order to provide you with the best plan of care or course of treatment. Therefore, I ask that you please give me 2 business days to thoroughly review all your results before contacting my office for clarification. Should we see a critical lab result, you will be contacted sooner.  ? ?It was a pleasure seeing you today! Please do not hesitate to reach out with any questions and or  concerns. ? ?Regards,  ? ?Saroya Riccobono ?FNP-C ? ? ?

## 2022-04-10 NOTE — Assessment & Plan Note (Signed)
Continue f/u with neurologist as scheduled. ?Ongoing chronic  ?

## 2022-04-10 NOTE — Assessment & Plan Note (Signed)
Melatonin as needed. °

## 2022-04-10 NOTE — Assessment & Plan Note (Signed)
Pt old gyn left, advised pt if she would like to f/u for pap here we can do so. Schedule appt as necessary. ?

## 2022-04-10 NOTE — Assessment & Plan Note (Signed)
Continue singulair 10 mg ?

## 2022-08-27 ENCOUNTER — Ambulatory Visit: Payer: Self-pay | Admitting: Psychiatry
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
## 2022-09-02 ENCOUNTER — Ambulatory Visit: Payer: Medicaid Other | Admitting: Psychiatry

## 2022-09-25 ENCOUNTER — Other Ambulatory Visit: Payer: Self-pay | Admitting: Family

## 2022-09-25 DIAGNOSIS — F418 Other specified anxiety disorders: Secondary | ICD-10-CM

## 2022-09-25 NOTE — Progress Notes (Signed)
Pt does c/o anxiety depression Followed by neurology has tried a few medications but can not tolerate due to N/V  Requesting psychology for help with therapy

## 2022-09-30 IMAGING — CR DG CERVICAL SPINE COMPLETE 4+V
1 series · 5 of 5 positions shown · non-contrast
Comparison: None.

CLINICAL DATA: Right-sided neck pain.  Progressive pain for 1 week.

EXAM:
CERVICAL SPINE - COMPLETE 4+ VIEW

[Series 1: dg cervical spine complete · 0.14mm/px · 5 of 5 slices shown]
[im 1/5]
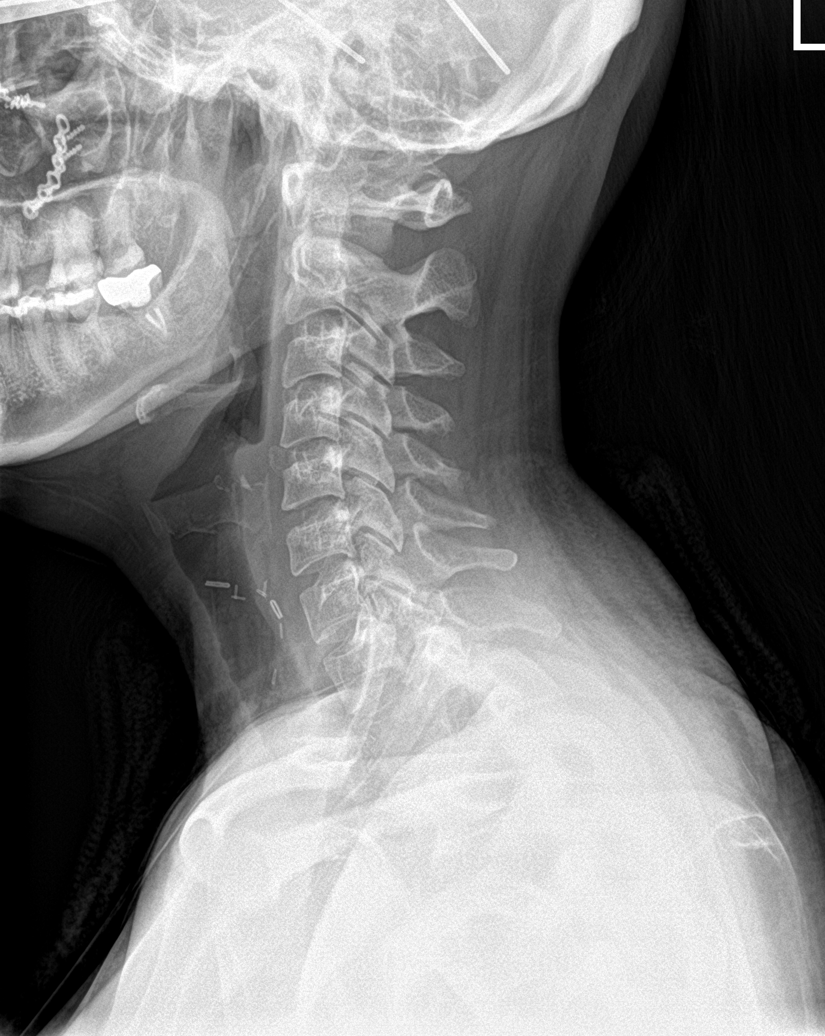
[im 2/5]
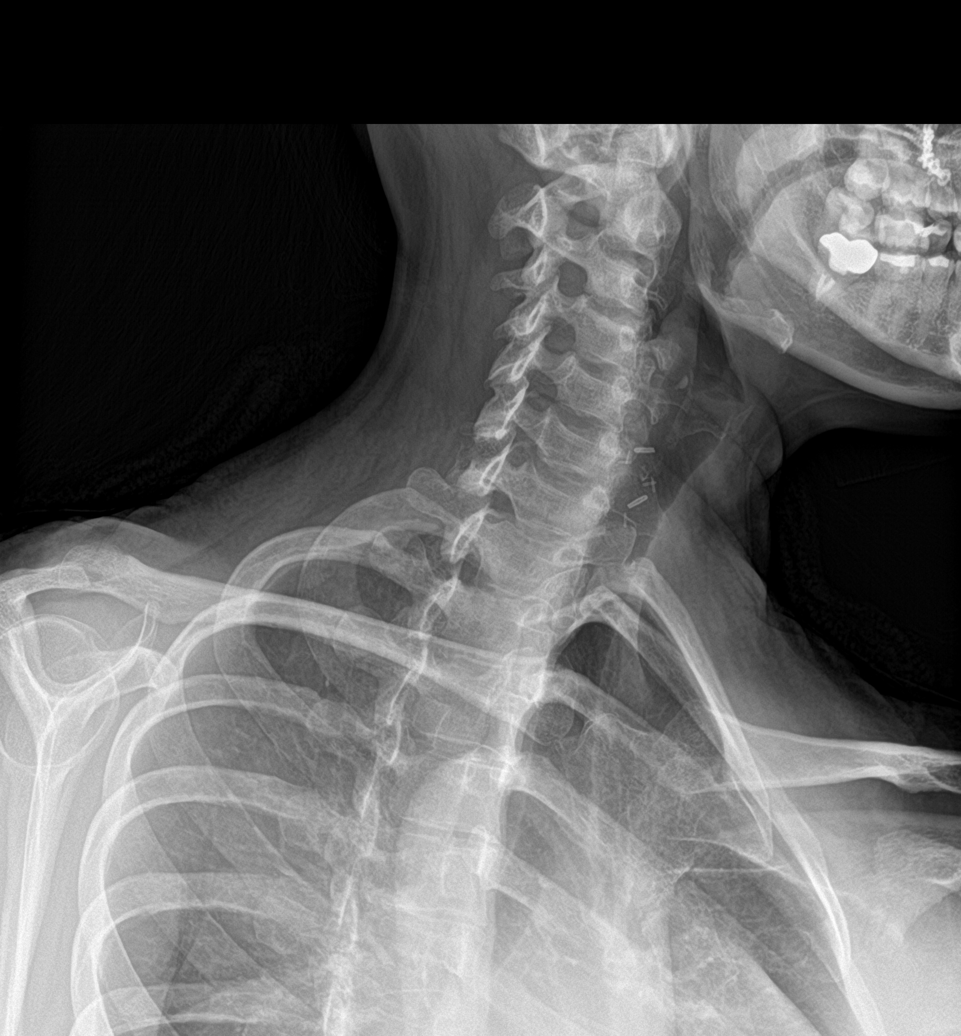
[im 3/5]
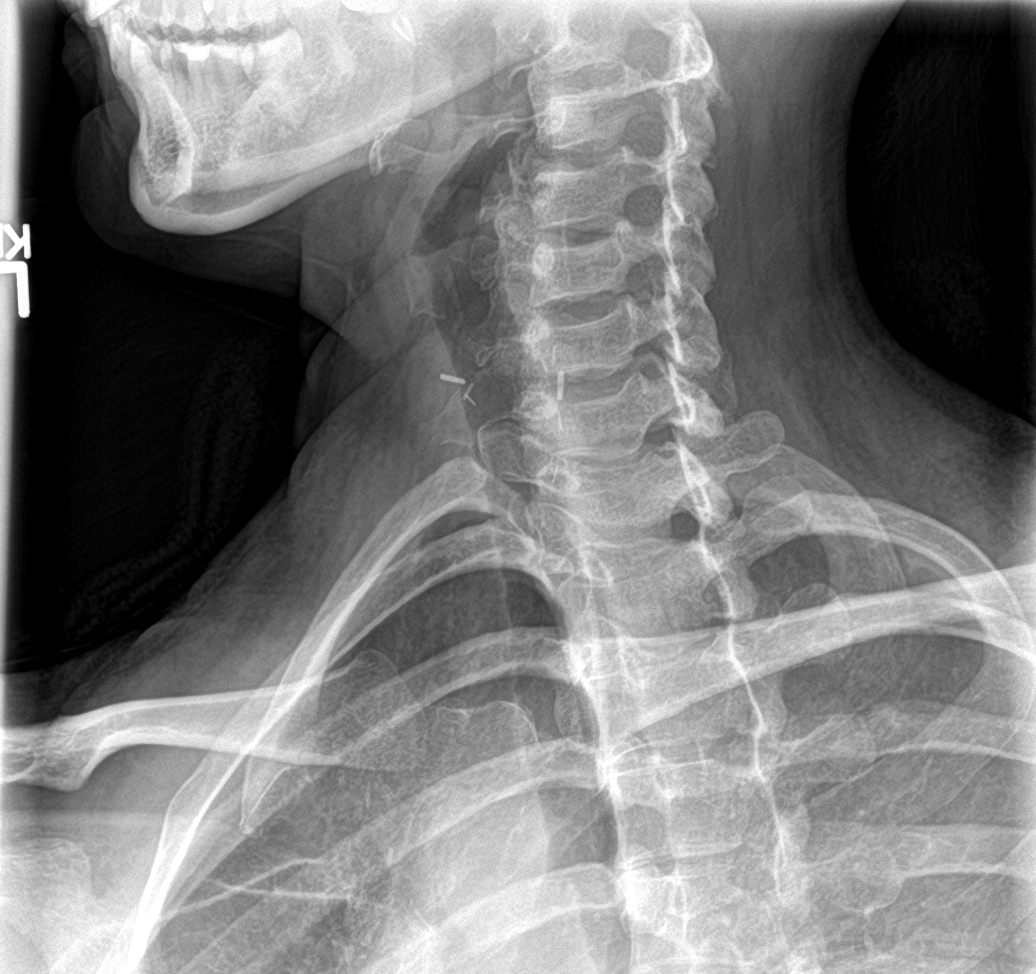
[im 4/5]
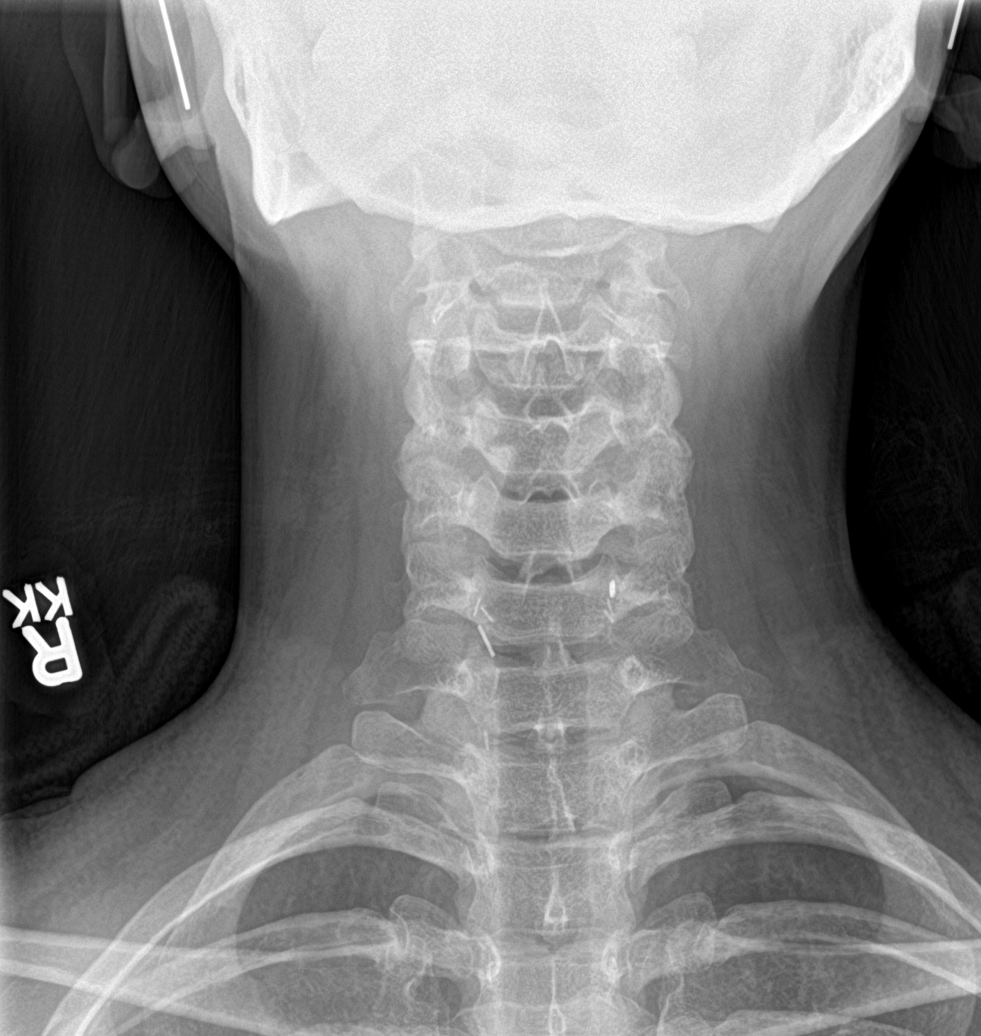
[im 5/5]
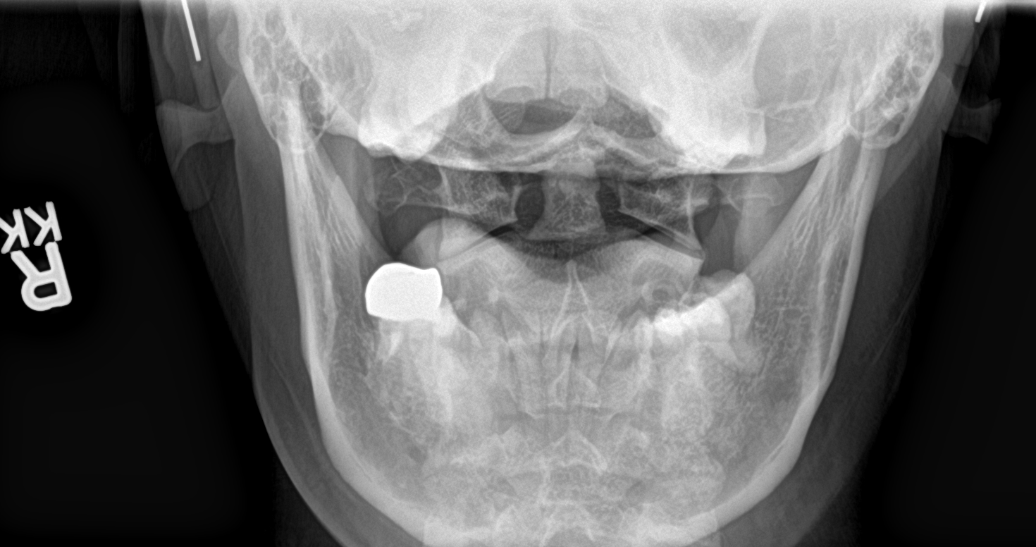

[5 of 5 positions shown; findings below may reference images not displayed]

FINDINGS: Cervical spine alignment is maintained. Vertebral body heights and
intervertebral disc spaces are preserved. The dens is intact.
Posterior elements appear well-aligned. There is no evidence of
fracture, focal bone lesion or bony destruction. No bony neural
foraminal narrowing. No prevertebral soft tissue edema. Surgical
clips in the soft tissues of the thoracic inlet
IMPRESSION: Negative cervical spine radiographs.

## 2022-09-30 IMAGING — CR DG SHOULDER 2+V*R*
1 series · 4 of 4 positions shown · non-contrast
Comparison: None.

CLINICAL DATA: Right shoulder pain with movement.

EXAM:
RIGHT SHOULDER - 2+ VIEW

[Series 1: dg shoulder right · 0.14mm/px · 4 of 4 slices shown]
[im 1/4]
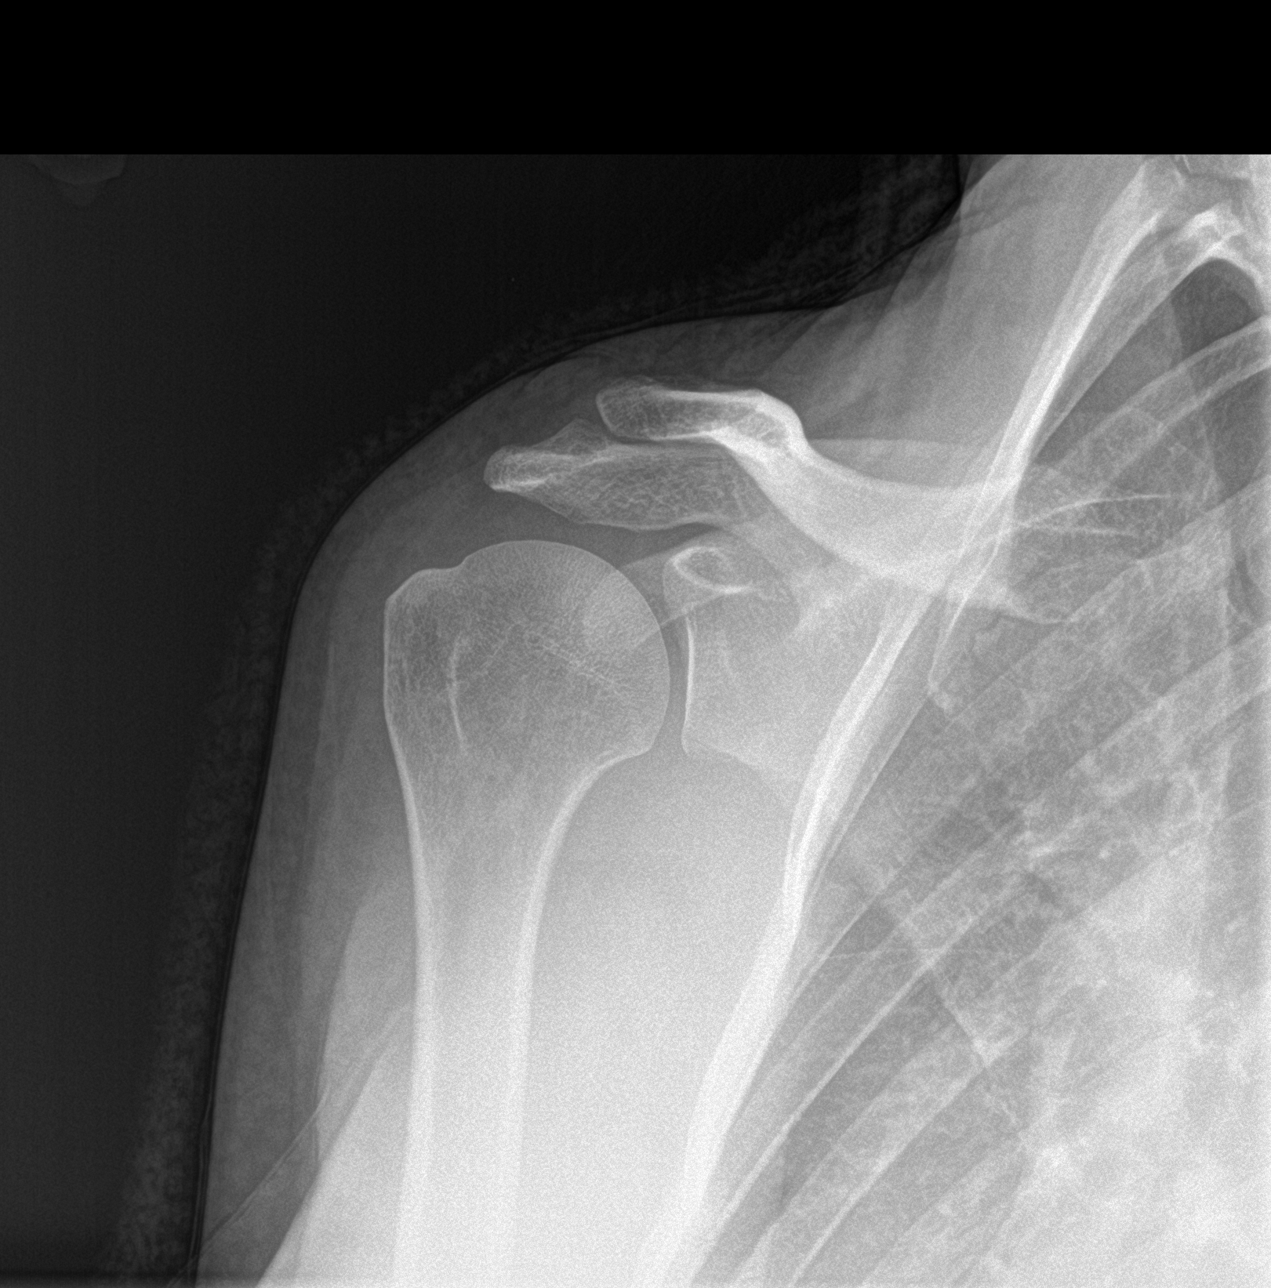
[im 2/4]
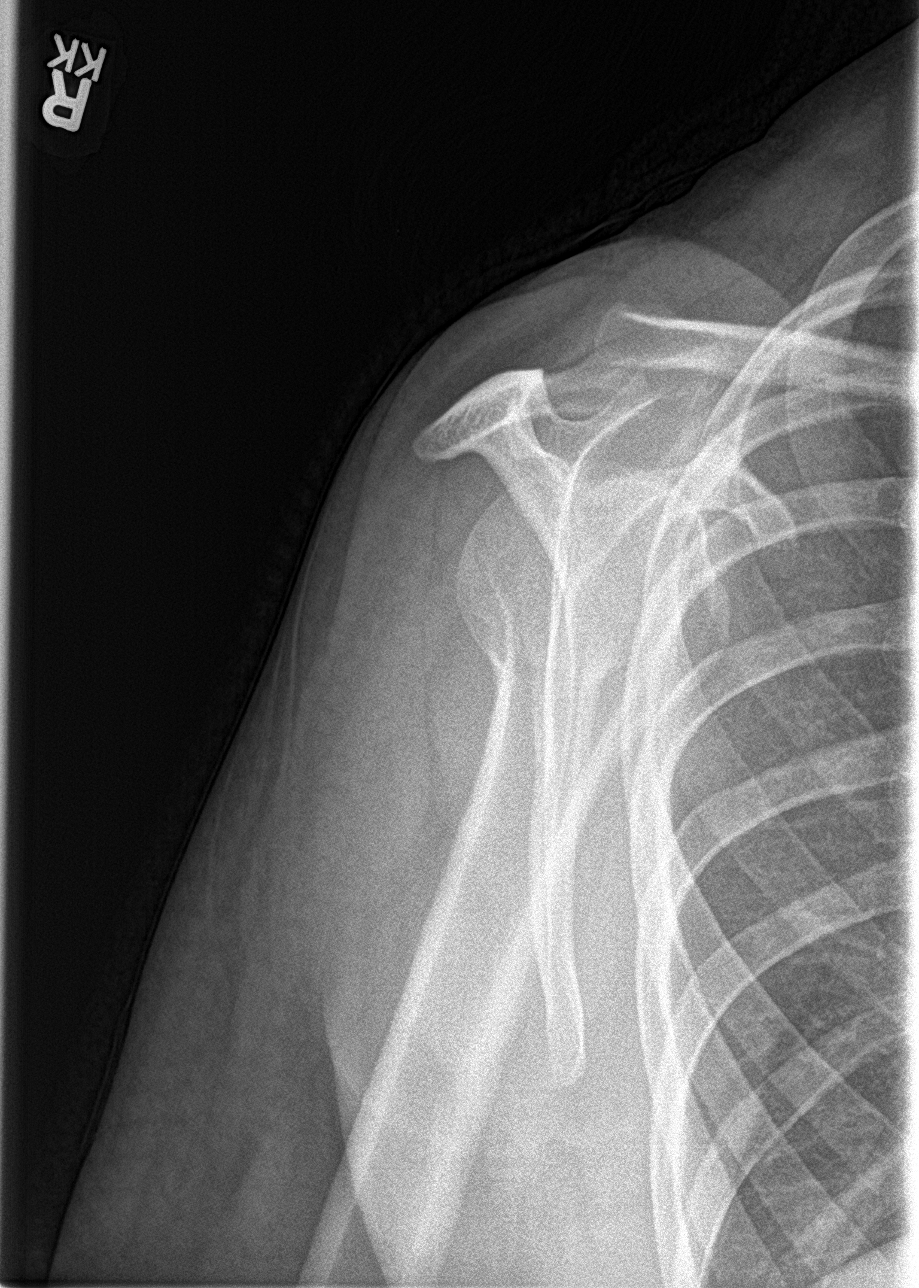
[im 3/4]
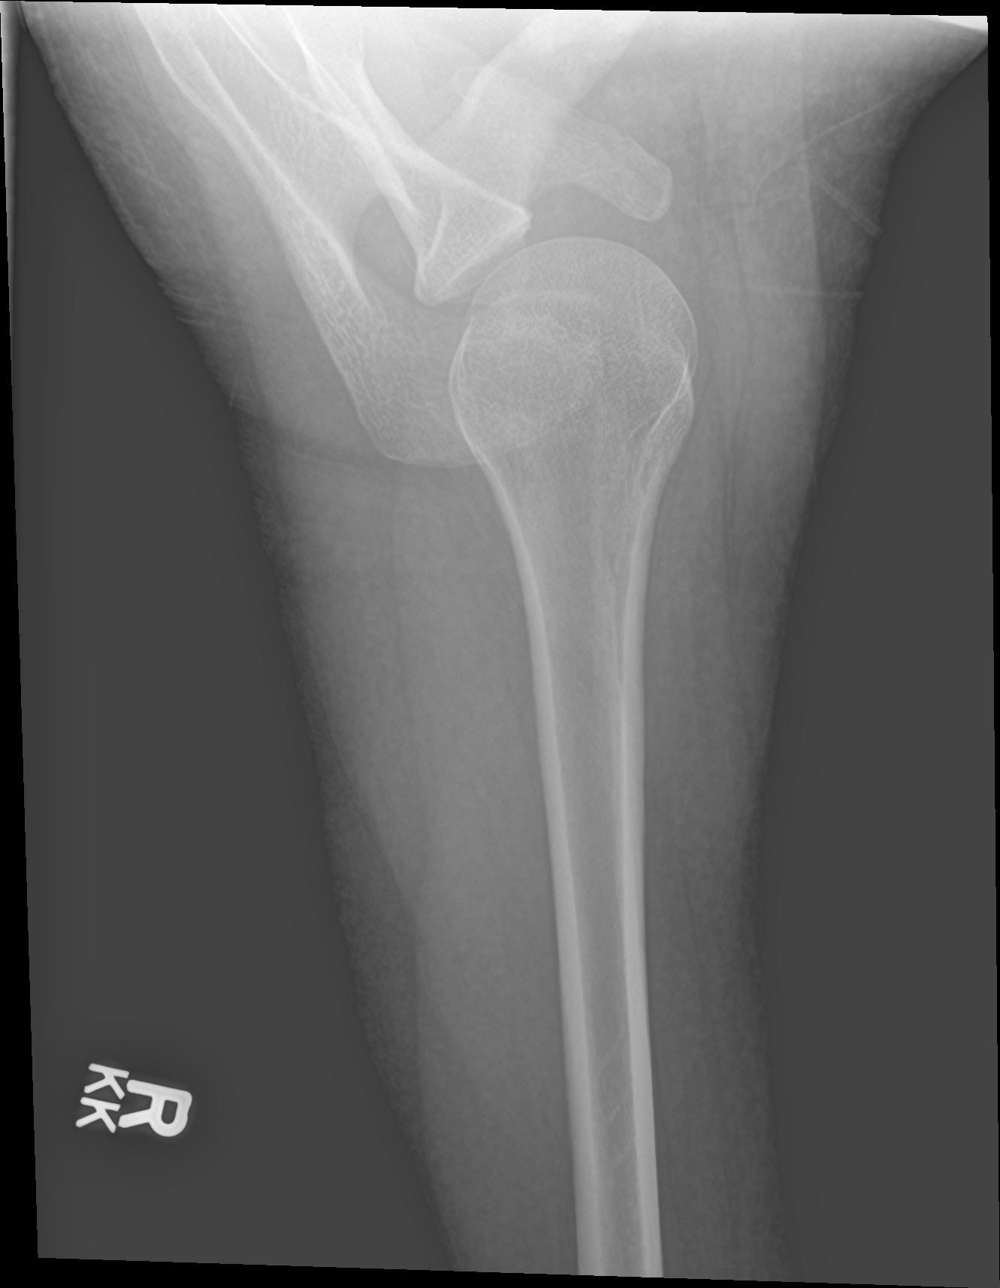
[im 4/4]
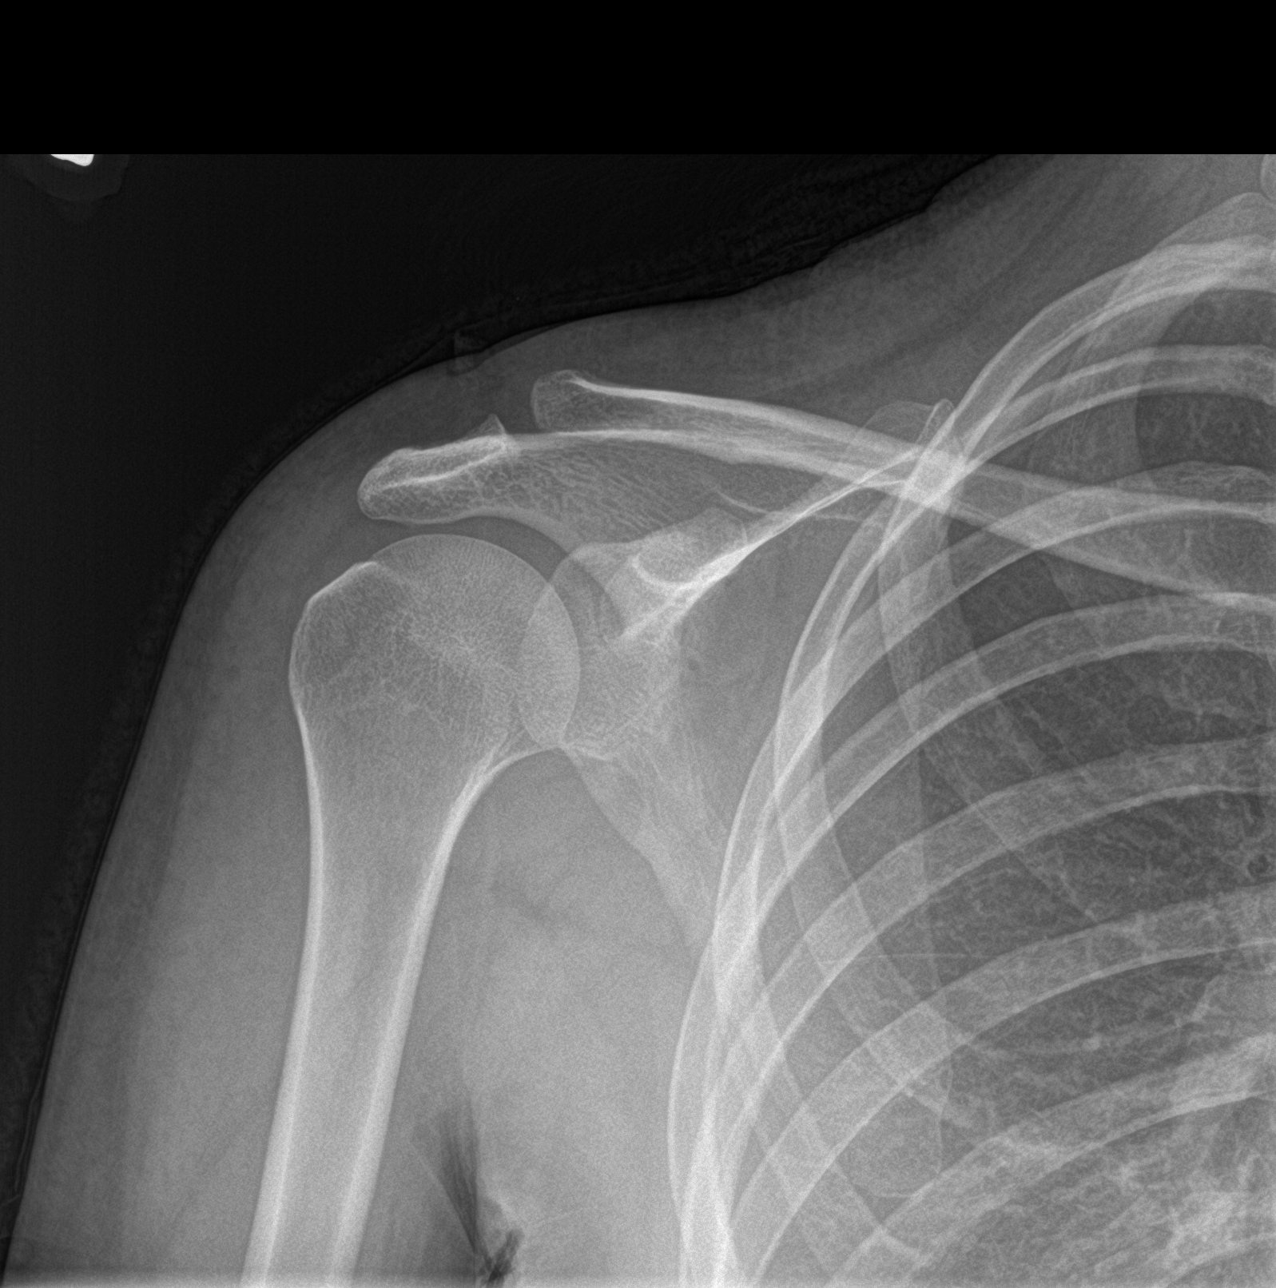

[4 of 4 positions shown; findings below may reference images not displayed]

FINDINGS: Normal bone mineralization. Mild inferior acromioclavicular joint
space narrowing and peripheral osteophytosis. No acute fracture or
dislocation. The visualized portion of the right lung is
unremarkable.
IMPRESSION: Minimal right acromioclavicular osteoarthritis.

## 2022-10-27 ENCOUNTER — Ambulatory Visit: Payer: Medicaid Other | Admitting: Psychiatry

## 2022-11-10 ENCOUNTER — Ambulatory Visit (INDEPENDENT_AMBULATORY_CARE_PROVIDER_SITE_OTHER): Payer: Medicaid Other | Admitting: Family

## 2022-11-10 VITALS — BP 116/68 | HR 72 | Temp 98.6°F | Resp 16 | Ht 63.0 in | Wt 156.1 lb

## 2022-11-10 DIAGNOSIS — J029 Acute pharyngitis, unspecified: Secondary | ICD-10-CM | POA: Diagnosis not present

## 2022-11-10 DIAGNOSIS — J020 Streptococcal pharyngitis: Secondary | ICD-10-CM | POA: Insufficient documentation

## 2022-11-10 DIAGNOSIS — R5383 Other fatigue: Secondary | ICD-10-CM | POA: Diagnosis not present

## 2022-11-10 DIAGNOSIS — E538 Deficiency of other specified B group vitamins: Secondary | ICD-10-CM | POA: Diagnosis not present

## 2022-11-10 DIAGNOSIS — E039 Hypothyroidism, unspecified: Secondary | ICD-10-CM | POA: Diagnosis not present

## 2022-11-10 LAB — BASIC METABOLIC PANEL
BUN: 12 mg/dL (ref 6–23)
CO2: 29 mEq/L (ref 19–32)
Calcium: 9.6 mg/dL (ref 8.4–10.5)
Chloride: 98 mEq/L (ref 96–112)
Creatinine, Ser: 0.6 mg/dL (ref 0.40–1.20)
GFR: 115.04 mL/min (ref >60.00)
Glucose, Bld: 81 mg/dL (ref 70–99)
Potassium: 4.8 mEq/L (ref 3.5–5.1)
Sodium: 133 mEq/L — ABNORMAL LOW (ref 135–145)

## 2022-11-10 LAB — CBC
HCT: 41.3 % (ref 36.0–46.0)
Hemoglobin: 14.4 g/dL (ref 12.0–15.0)
MCHC: 34.8 g/dL (ref 30.0–36.0)
MCV: 96.5 fl (ref 78.0–100.0)
Platelets: 214 10*3/uL (ref 150.0–400.0)
RBC: 4.28 Mil/uL (ref 3.87–5.11)
RDW: 12.5 % (ref 11.5–15.5)
WBC: 6.3 10*3/uL (ref 4.0–10.5)

## 2022-11-10 LAB — POCT RAPID STREP A (OFFICE): Rapid Strep A Screen: POSITIVE — AB

## 2022-11-10 LAB — TSH: TSH: 2.26 u[IU]/mL (ref 0.35–5.50)

## 2022-11-10 LAB — VITAMIN B12: Vitamin B-12: 150 pg/mL — ABNORMAL LOW (ref 211–911)

## 2022-11-10 MED ORDER — AMOXICILLIN 500 MG PO CAPS: 500.0000 mg | ORAL_CAPSULE | Freq: Two times a day (BID) | ORAL | 0 refills | Status: AC

## 2022-11-10 NOTE — Assessment & Plan Note (Signed)
Ordering b12 pending results 

## 2022-11-10 NOTE — Assessment & Plan Note (Signed)
Pt seeing endo however requesting due to feeling symptomatic with fatigue to check tsh. Goal 1-2 with tsh levels. Ordering tsh pending results.

## 2022-11-10 NOTE — Assessment & Plan Note (Signed)
Strep tested positive in office.  rx for amox 500 mg po bid x 10 days.  Ibuprofen/tyelnol prn sore throat/fever Pt told to F/u if no improvement in the next 2-3 days.  
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
## 2022-11-10 NOTE — Patient Instructions (Addendum)
  Stop by the lab prior to leaving today. I will notify you of your results once received.   ------------------------------------  You were found to be strep positive,  Take antibiotics that have been sent to the pharmacy.  Change your toothbrush after 24 hours on the antibiotics.  Gargle with warm salt water as needed for sore throat.    ------------------------------------  [facility] Phone # [phone] They have locations in [place], [place], [place], and [place]. They do take Healthy blue I know for sure and the [employer].    ------------------------------------  [facility] [phone]  [address] [place], [place], [place] [address]    [facility] [employer] [address] [place], [place] [address]   [phone]   If at any time you feel your needs are more urgent or you are concerned for your well being please see the below options:  For walk in options for mental health:   [facility] [address] in [place], [place]. Call our 24-hour HelpLine at (925)214-3993 or [phone] for immediate assistance for mental health and substance abuse issues.  And or walk into [facility] hospital ER.   [employer]: 1-800-SUICIDE  [employer] Suicide Prevention Lifeline: 1-800-273-TALK  Regards,   Mort Sawyers FNP-C

## 2022-11-10 NOTE — Addendum Note (Signed)
Addended by: Vertis Kelch on: 11/10/2022 10:40 AM   Modules accepted: Orders

## 2022-11-11 NOTE — Progress Notes (Signed)
Vitamin B12 very low, please set up for B12 injections, 1000 mcg IM once monthly for three months, also schedule 3 month f/u appt to repeat labs and f/u in office. Recommend also oral otc 1000 mcg once daily vitamin B12  Low b12 could contribute to memory /brain fog issues.  Thyroid ok.

## 2022-11-13 ENCOUNTER — Ambulatory Visit: Payer: Medicaid Other

## 2022-11-27 ENCOUNTER — Ambulatory Visit (INDEPENDENT_AMBULATORY_CARE_PROVIDER_SITE_OTHER): Payer: Medicaid Other | Admitting: Family

## 2022-11-27 VITALS — BP 108/66 | HR 72 | Temp 97.9°F | Ht 63.0 in | Wt 157.0 lb

## 2022-11-27 DIAGNOSIS — M25562 Pain in left knee: Secondary | ICD-10-CM | POA: Insufficient documentation

## 2022-11-27 DIAGNOSIS — Z23 Encounter for immunization: Secondary | ICD-10-CM | POA: Diagnosis not present

## 2022-11-27 DIAGNOSIS — Z124 Encounter for screening for malignant neoplasm of cervix: Secondary | ICD-10-CM

## 2022-11-27 DIAGNOSIS — M2550 Pain in unspecified joint: Secondary | ICD-10-CM

## 2022-11-27 DIAGNOSIS — E538 Deficiency of other specified B group vitamins: Secondary | ICD-10-CM | POA: Diagnosis not present

## 2022-11-27 MED ORDER — CYANOCOBALAMIN 1000 MCG/ML IJ SOLN
1000.0000 ug | Freq: Once | INTRAMUSCULAR | Status: AC
  Administered 2022-11-27: 1000 ug via INTRAMUSCULAR

## 2022-11-27 NOTE — Patient Instructions (Signed)
  Recommend tylenol and also lidocaine patch 4%  Could consider voltaren gel

## 2022-11-27 NOTE — Assessment & Plan Note (Signed)
B12 injection today in office.  Continue otc b12 1000 mcg  
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
## 2022-11-27 NOTE — Assessment & Plan Note (Signed)
Holly Middleton rf sed rate ordered pending results to determine if need for rheum vs ortho

## 2022-12-02 ENCOUNTER — Other Ambulatory Visit (INDEPENDENT_AMBULATORY_CARE_PROVIDER_SITE_OTHER): Payer: Medicaid Other

## 2022-12-02 DIAGNOSIS — M2550 Pain in unspecified joint: Secondary | ICD-10-CM | POA: Diagnosis not present

## 2022-12-02 LAB — SEDIMENTATION RATE: Sed Rate: 3 mm/hr (ref 0–20)

## 2022-12-04 LAB — ANTI-NUCLEAR AB-TITER (ANA TITER)
ANA TITER: 1:80 {titer} — ABNORMAL HIGH
ANA Titer 1: 1:160 {titer} — ABNORMAL HIGH

## 2022-12-04 LAB — RHEUMATOID FACTOR: Rheumatoid fact SerPl-aCnc: <14 IU/mL (ref <14)

## 2022-12-04 LAB — ANA: Anti Nuclear Antibody (ANA): POSITIVE — AB

## 2022-12-05 ENCOUNTER — Other Ambulatory Visit: Payer: Self-pay | Admitting: Family

## 2022-12-05 DIAGNOSIS — M2550 Pain in unspecified joint: Secondary | ICD-10-CM

## 2022-12-05 DIAGNOSIS — R768 Other specified abnormal immunological findings in serum: Secondary | ICD-10-CM | POA: Insufficient documentation

## 2022-12-05 NOTE — Progress Notes (Signed)
Your ana came back positive, this could indicate an autoimmune disease, so I am going to refer you to rheumatology to have a more solid workup. If you do not hear anything in the next 1-2 weeks please let me know.

## 2022-12-31 ENCOUNTER — Ambulatory Visit (INDEPENDENT_AMBULATORY_CARE_PROVIDER_SITE_OTHER): Payer: Medicaid Other

## 2022-12-31 DIAGNOSIS — E538 Deficiency of other specified B group vitamins: Secondary | ICD-10-CM | POA: Diagnosis not present

## 2022-12-31 MED ORDER — CYANOCOBALAMIN 1000 MCG/ML IJ SOLN
1000.0000 ug | Freq: Once | INTRAMUSCULAR | Status: AC
  Administered 2022-12-31: 1000 ug via INTRAMUSCULAR

## 2022-12-31 NOTE — Progress Notes (Signed)
Per orders of Eugenia Pancoast, NP, injection of B-12 given by Francella Solian in right deltoid. Patient tolerated injection well. Patient will make appointment for 1 month.

## 2023-02-03 ENCOUNTER — Ambulatory Visit (INDEPENDENT_AMBULATORY_CARE_PROVIDER_SITE_OTHER): Payer: Medicaid Other

## 2023-02-03 DIAGNOSIS — E538 Deficiency of other specified B group vitamins: Secondary | ICD-10-CM | POA: Diagnosis not present

## 2023-02-03 MED ORDER — CYANOCOBALAMIN 1000 MCG/ML IJ SOLN
1000.0000 ug | Freq: Once | INTRAMUSCULAR | Status: AC
  Administered 2023-02-03: 1000 ug via INTRAMUSCULAR

## 2023-02-03 NOTE — Progress Notes (Signed)
Per orders of [facility] FNP, injection of vitamin B 12 in left deltoid given by Ozzie Hoyle. Patient tolerated injection well.

## 2023-02-23 ENCOUNTER — Ambulatory Visit: Payer: Medicaid Other | Admitting: Family

## 2023-02-23 ENCOUNTER — Encounter: Payer: Self-pay | Admitting: Family

## 2023-02-23 VITALS — BP 104/64 | HR 80 | Temp 97.9°F | Ht 63.0 in | Wt 156.4 lb

## 2023-02-23 DIAGNOSIS — L309 Dermatitis, unspecified: Secondary | ICD-10-CM

## 2023-02-23 DIAGNOSIS — E538 Deficiency of other specified B group vitamins: Secondary | ICD-10-CM | POA: Diagnosis not present

## 2023-02-23 DIAGNOSIS — K640 First degree hemorrhoids: Secondary | ICD-10-CM | POA: Diagnosis not present

## 2023-02-23 DIAGNOSIS — J4520 Mild intermittent asthma, uncomplicated: Secondary | ICD-10-CM | POA: Diagnosis not present

## 2023-02-23 MED ORDER — TRIAMCINOLONE ACETONIDE 0.1 % EX CREA: 1.0000 | TOPICAL_CREAM | Freq: Two times a day (BID) | CUTANEOUS | 0 refills | Status: DC

## 2023-02-23 MED ORDER — ALBUTEROL SULFATE HFA 108 (90 BASE) MCG/ACT IN AERS: 2.0000 | INHALATION_SPRAY | Freq: Four times a day (QID) | RESPIRATORY_TRACT | 0 refills | Status: DC | PRN

## 2023-02-23 MED ORDER — HYDROCORTISONE (PERIANAL) 2.5 % EX CREA: 1.0000 | TOPICAL_CREAM | Freq: Two times a day (BID) | CUTANEOUS | 1 refills | Status: DC

## 2023-02-23 NOTE — Assessment & Plan Note (Signed)
Ordered b12 pending results Continue b12 monthly injections x 3 months, start otc 1000 mcg once daily as well as repeat b12 in four months.

## 2023-02-23 NOTE — Progress Notes (Signed)
Established Patient Office Visit  Subjective:      CC:  Chief Complaint  Patient presents with   Medical Management of Chronic Issues    HPI: Holly Middleton is a 37 y.o. female presenting on 02/23/2023 for Medical Management of Chronic Issues . Recently seen by the rheumatologist, and was diagnosed with sjogrens, and given tizanidine for muscle spasms. She is going to get this started. He wants her to take this nightly, and she states he told her to take this nightly.   B12 injection 1000 mcg Q month , had for three months. Has not been taking pills otc.  Lab Results  Component Value Date   VITAMINB12 150 (L) 11/10/2022    New complaints: N/a     Social history:  Relevant past medical, surgical, family and social history reviewed and updated as indicated. Interim medical history since our last visit reviewed.  Allergies and medications reviewed and updated.  DATA REVIEWED: CHART IN EPIC     ROS: Negative unless specifically indicated above in HPI.    Current Outpatient Medications:    divalproex (DEPAKOTE) 500 MG DR tablet, Take 500 mg by mouth 2 (two) times daily. , Disp: , Rfl:    levETIRAcetam (KEPPRA) 750 MG tablet, Take 1 tablet (750 mg total) by mouth 2 (two) times daily., Disp: 60 tablet, Rfl: 0   levothyroxine (SYNTHROID) 112 MCG tablet, Take 1 tablet (112 mcg total) by mouth daily before breakfast., Disp: 90 tablet, Rfl: 0   loratadine (CLARITIN) 10 MG tablet, Take 10 mg by mouth daily as needed for allergies., Disp: , Rfl:    Melatonin 3 MG CAPS, Take by mouth., Disp: , Rfl:    tiZANidine (ZANAFLEX) 2 MG tablet, Take by mouth., Disp: , Rfl:    albuterol (VENTOLIN HFA) 108 (90 Base) MCG/ACT inhaler, Inhale 2 puffs into the lungs every 6 (six) hours as needed for wheezing or shortness of breath., Disp: 8 g, Rfl: 0   hydrocortisone (ANUSOL-HC) 2.5 % rectal cream, Place 1 Application rectally 2 (two) times daily., Disp: 30 g, Rfl: 1   triamcinolone cream  (KENALOG) 0.1 %, Apply 1 Application topically 2 (two) times daily., Disp: 30 g, Rfl: 0      Objective:    BP 104/64   Pulse 80   Temp 97.9 F (36.6 C) (Temporal)   Ht '5\' 3"'$  (1.6 m)   Wt 156 lb 6.4 oz (70.9 kg)   LMP  (LMP Unknown)   SpO2 99%   BMI 27.71 kg/m   Wt Readings from Last 3 Encounters:  02/23/23 156 lb 6.4 oz (70.9 kg)  11/27/22 157 lb (71.2 kg)  11/10/22 156 lb 2 oz (70.8 kg)    Physical Exam  Physical Exam Constitutional:      General: not in acute distress.    Appearance: Normal appearance. normal weight. is not ill-appearing, toxic-appearing or diaphoretic.  Cardiovascular:     Rate and Rhythm: Normal rate.  Pulmonary:     Effort: Pulmonary effort is normal.  Musculoskeletal:        General: Normal range of motion.  Neurological:     General: No focal deficit present.     Mental Status: alert and oriented to person, place, and time. Mental status is at baseline.  Psychiatric:        Mood and Affect: Mood normal.        Behavior: Behavior normal.        Thought Content: Thought content normal.  Judgment: Judgment normal.        Assessment & Plan:  Vitamin B12 deficiency -     Vitamin B12; Future  Mild intermittent extrinsic asthma without complication Assessment & Plan: Stable  Refill albuterol   Orders: -     Albuterol Sulfate HFA; Inhale 2 puffs into the lungs every 6 (six) hours as needed for wheezing or shortness of breath.  Dispense: 8 g; Refill: 0  Grade I hemorrhoids Assessment & Plan: Refill anusol cream   Orders: -     Hydrocortisone (Perianal); Place 1 Application rectally 2 (two) times daily.  Dispense: 30 g; Refill: 1  Eczema, unspecified type -     Triamcinolone Acetonide; Apply 1 Application topically 2 (two) times daily.  Dispense: 30 g; Refill: 0  B12 deficiency Assessment & Plan: Ordered b12 pending results Continue b12 monthly injections x 3 months, start otc 1000 mcg once daily as well as repeat b12 in four  months.       Return in about 1 year (around 02/24/2024) for f/u CPE.  Eugenia Pancoast, MSN, APRN, FNP-C [place]

## 2023-02-23 NOTE — Assessment & Plan Note (Signed)
Refill anusol cream

## 2023-02-23 NOTE — Assessment & Plan Note (Signed)
Stable  Refill albuterol

## 2023-02-23 NOTE — Patient Instructions (Signed)
  Recommend b12 1000 mcg once daily over the counter.    Regards,   Eugenia Pancoast FNP-C

## 2023-02-26 ENCOUNTER — Ambulatory Visit: Payer: Medicaid Other | Admitting: Family

## 2023-03-26 ENCOUNTER — Emergency Department: Payer: Medicaid Other

## 2023-03-26 ENCOUNTER — Other Ambulatory Visit: Payer: Self-pay

## 2023-03-26 ENCOUNTER — Emergency Department
Admission: EM | Admit: 2023-03-26 | Discharge: 2023-03-26 | Disposition: A | Discharge status: Home / Self Care | Payer: Medicaid Other | Attending: Emergency Medicine | Admitting: Emergency Medicine

## 2023-03-26 DIAGNOSIS — Y92410 Unspecified street and highway as the place of occurrence of the external cause: Secondary | ICD-10-CM | POA: Diagnosis not present

## 2023-03-26 DIAGNOSIS — M79644 Pain in right finger(s): Secondary | ICD-10-CM | POA: Insufficient documentation

## 2023-03-26 DIAGNOSIS — R072 Precordial pain: Secondary | ICD-10-CM | POA: Insufficient documentation

## 2023-03-26 DIAGNOSIS — R1013 Epigastric pain: Secondary | ICD-10-CM | POA: Insufficient documentation

## 2023-03-26 LAB — COMPREHENSIVE METABOLIC PANEL
ALT: 15 U/L (ref 0–44)
AST: 19 U/L (ref 15–41)
Albumin: 4.2 g/dL (ref 3.5–5.0)
Alkaline Phosphatase: 51 U/L (ref 38–126)
Anion gap: 10 (ref 5–15)
BUN: 14 mg/dL (ref 6–20)
CO2: 25 mmol/L (ref 22–32)
Calcium: 8.9 mg/dL (ref 8.9–10.3)
Chloride: 102 mmol/L (ref 98–111)
Creatinine, Ser: 0.67 mg/dL (ref 0.44–1.00)
GFR, Estimated: >60 mL/min (ref >60)
Glucose, Bld: 98 mg/dL (ref 70–99)
Potassium: 3.5 mmol/L (ref 3.5–5.1)
Sodium: 137 mmol/L (ref 135–145)
Total Bilirubin: 0.6 mg/dL (ref 0.3–1.2)
Total Protein: 7.7 g/dL (ref 6.5–8.1)

## 2023-03-26 LAB — CBC WITH DIFFERENTIAL/PLATELET
Abs Immature Granulocytes: 0.08 10*3/uL — ABNORMAL HIGH (ref 0.00–0.07)
Basophils Absolute: 0 10*3/uL (ref 0.0–0.1)
Basophils Relative: 0 %
Eosinophils Absolute: 0 10*3/uL (ref 0.0–0.5)
Eosinophils Relative: 0 %
HCT: 42.3 % (ref 36.0–46.0)
Hemoglobin: 14.7 g/dL (ref 12.0–15.0)
Immature Granulocytes: 1 %
Lymphocytes Relative: 16 %
Lymphs Abs: 1.8 10*3/uL (ref 0.7–4.0)
MCH: 32.9 pg (ref 26.0–34.0)
MCHC: 34.8 g/dL (ref 30.0–36.0)
MCV: 94.6 fL (ref 80.0–100.0)
Monocytes Absolute: 0.6 10*3/uL (ref 0.1–1.0)
Monocytes Relative: 5 %
Neutro Abs: 8.3 10*3/uL — ABNORMAL HIGH (ref 1.7–7.7)
Neutrophils Relative %: 78 %
Platelets: 236 10*3/uL (ref 150–400)
RBC: 4.47 MIL/uL (ref 3.87–5.11)
RDW: 11.9 % (ref 11.5–15.5)
WBC: 10.7 10*3/uL — ABNORMAL HIGH (ref 4.0–10.5)
nRBC: 0 % (ref 0.0–0.2)

## 2023-03-26 LAB — POC URINE PREG, ED: Preg Test, Ur: NEGATIVE

## 2023-03-26 LAB — TROPONIN I (HIGH SENSITIVITY): Troponin I (High Sensitivity): 2 ng/L (ref <18)

## 2023-03-26 MED ORDER — IOHEXOL 300 MG/ML  SOLN
100.0000 mL | Freq: Once | INTRAMUSCULAR | Status: AC | PRN
  Administered 2023-03-26: 100 mL via INTRAVENOUS

## 2023-03-26 MED ORDER — HYDROCODONE-ACETAMINOPHEN 5-325 MG PO TABS: 1.0000 | ORAL_TABLET | Freq: Four times a day (QID) | ORAL | 0 refills | Status: AC | PRN

## 2023-03-26 MED ORDER — ONDANSETRON 4 MG PO TBDP: 4.0000 mg | ORAL_TABLET | Freq: Three times a day (TID) | ORAL | 0 refills | Status: AC | PRN

## 2023-03-26 MED ORDER — MORPHINE SULFATE (PF) 4 MG/ML IV SOLN
4.0000 mg | Freq: Once | INTRAVENOUS | Status: AC
  Administered 2023-03-26: 4 mg via INTRAVENOUS
  Filled 2023-03-26: qty 1

## 2023-03-26 MED ORDER — LACTATED RINGERS IV BOLUS
1000.0000 mL | Freq: Once | INTRAVENOUS | Status: AC
  Administered 2023-03-26: 1000 mL via INTRAVENOUS

## 2023-03-26 MED ORDER — ONDANSETRON HCL 4 MG/2ML IJ SOLN
4.0000 mg | Freq: Once | INTRAMUSCULAR | Status: AC
  Administered 2023-03-26: 4 mg via INTRAVENOUS
  Filled 2023-03-26: qty 2
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
## 2023-03-26 NOTE — ED Notes (Signed)
Pt. Ambulated with assistance and wheelchair to bathroom and back to stretcher. Gait slow and steady.
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
## 2023-03-31 ENCOUNTER — Ambulatory Visit (INDEPENDENT_AMBULATORY_CARE_PROVIDER_SITE_OTHER): Payer: Medicaid Other | Admitting: Nurse Practitioner

## 2023-03-31 ENCOUNTER — Encounter: Payer: Self-pay | Admitting: Nurse Practitioner

## 2023-03-31 DIAGNOSIS — T148XXA Other injury of unspecified body region, initial encounter: Secondary | ICD-10-CM | POA: Diagnosis not present

## 2023-03-31 DIAGNOSIS — M542 Cervicalgia: Secondary | ICD-10-CM | POA: Diagnosis not present

## 2023-03-31 DIAGNOSIS — Z09 Encounter for follow-up examination after completed treatment for conditions other than malignant neoplasm: Secondary | ICD-10-CM

## 2023-03-31 MED ORDER — HYDROCODONE-ACETAMINOPHEN 5-325 MG PO TABS: 1.0000 | ORAL_TABLET | Freq: Four times a day (QID) | ORAL | 0 refills | Status: AC | PRN

## 2023-03-31 NOTE — Patient Instructions (Addendum)
Nice to see you today I have sent in some more pain medication I would recommend taking the tizanidine.  This will take another 7-10 days but you should slowly start improving
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
## 2023-03-31 NOTE — Assessment & Plan Note (Signed)
Continue taking prescribed pain medication and muscle relaxer as recommended separate pain medication muscle laxer 3 hours.

## 2023-03-31 NOTE — Assessment & Plan Note (Signed)
Did review hospital notes and imaging and blood work.
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
## 2023-04-10 ENCOUNTER — Encounter: Payer: Self-pay | Admitting: Family

## 2023-04-10 NOTE — Telephone Encounter (Signed)
Pt is requesting a referral to sports medicine or a chiropractor
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
## 2023-04-16 ENCOUNTER — Encounter: Payer: Self-pay | Admitting: Family Medicine

## 2023-04-16 ENCOUNTER — Ambulatory Visit (INDEPENDENT_AMBULATORY_CARE_PROVIDER_SITE_OTHER): Payer: Medicaid Other | Admitting: Family Medicine

## 2023-04-16 VITALS — BP 92/60 | HR 81 | Temp 98.0°F | Ht 63.0 in | Wt 164.0 lb

## 2023-04-16 DIAGNOSIS — M542 Cervicalgia: Secondary | ICD-10-CM | POA: Diagnosis not present

## 2023-04-16 DIAGNOSIS — M549 Dorsalgia, unspecified: Secondary | ICD-10-CM | POA: Diagnosis not present

## 2023-04-19 ENCOUNTER — Encounter: Payer: Self-pay | Admitting: Family Medicine

## 2023-04-29 ENCOUNTER — Telehealth: Payer: Self-pay | Admitting: Family

## 2023-04-29 NOTE — Telephone Encounter (Signed)
See referral notes. The referral was sent internally to [facility] PT on 04/16/2023. Pt was notified via Mychart letter of the location her referral was sent to. I have sent another message to the patient with her referral information.

## 2023-04-29 NOTE — Telephone Encounter (Signed)
Pt called in to know status of referral    Referred To  [facility]  Physical Therapy stated have not herd anything . Pleas advise [phone]

## 2023-05-07 ENCOUNTER — Telehealth: Payer: Self-pay | Admitting: Family

## 2023-05-07 NOTE — Telephone Encounter (Signed)
Patient visited the office and requested a new referral for physical therapy. Would like this referral sent to a different location, states the original location will not have an appointment available until later in June and that this matter is urgent. Would like this referral sent with the same information as the previous one, requested the  area and patient needs an office that accepts [place] complete medicaid. Please advise, sent to provider and referral coordinator.

## 2023-05-07 NOTE — Telephone Encounter (Signed)
See current referral in Epic, new referral is not needed, I will update the current referral.   See referral for updates.

## 2023-06-04 ENCOUNTER — Encounter: Payer: Self-pay | Admitting: Family

## 2023-06-04 ENCOUNTER — Ambulatory Visit (INDEPENDENT_AMBULATORY_CARE_PROVIDER_SITE_OTHER): Payer: Medicaid Other | Admitting: Family

## 2023-06-04 VITALS — BP 120/72 | HR 72 | Temp 97.6°F | Ht 63.0 in | Wt 166.0 lb

## 2023-06-04 DIAGNOSIS — M542 Cervicalgia: Secondary | ICD-10-CM

## 2023-06-04 DIAGNOSIS — E538 Deficiency of other specified B group vitamins: Secondary | ICD-10-CM | POA: Insufficient documentation

## 2023-06-04 DIAGNOSIS — D72829 Elevated white blood cell count, unspecified: Secondary | ICD-10-CM | POA: Diagnosis not present

## 2023-06-04 DIAGNOSIS — Z87828 Personal history of other (healed) physical injury and trauma: Secondary | ICD-10-CM

## 2023-06-04 LAB — CBC WITH DIFFERENTIAL/PLATELET
Basophils Absolute: 0 10*3/uL (ref 0.0–0.1)
Basophils Relative: 0.4 % (ref 0.0–3.0)
Eosinophils Absolute: 0 10*3/uL (ref 0.0–0.7)
Eosinophils Relative: 0.3 % (ref 0.0–5.0)
HCT: 42.1 % (ref 36.0–46.0)
Hemoglobin: 14.1 g/dL (ref 12.0–15.0)
Lymphocytes Relative: 29.7 % (ref 12.0–46.0)
Lymphs Abs: 2 10*3/uL (ref 0.7–4.0)
MCHC: 33.5 g/dL (ref 30.0–36.0)
MCV: 97.8 fl (ref 78.0–100.0)
Monocytes Absolute: 0.4 10*3/uL (ref 0.1–1.0)
Monocytes Relative: 5.9 % (ref 3.0–12.0)
Neutro Abs: 4.3 10*3/uL (ref 1.4–7.7)
Neutrophils Relative %: 63.7 % (ref 43.0–77.0)
Platelets: 241 10*3/uL (ref 150.0–400.0)
RBC: 4.31 Mil/uL (ref 3.87–5.11)
RDW: 12.8 % (ref 11.5–15.5)
WBC: 6.7 10*3/uL (ref 4.0–10.5)

## 2023-06-04 LAB — B12 AND FOLATE PANEL
Folate: 13.3 ng/mL (ref >5.9)
Vitamin B-12: 728 pg/mL (ref 211–911)

## 2023-06-04 NOTE — Assessment & Plan Note (Signed)
Repeat b12 levels today  Also ordering b12 folate mma and homocysteine to confirm  Continue otc b12 1000 mcg once daily 
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
## 2023-06-04 NOTE — Assessment & Plan Note (Addendum)
Ongoing.  Did advise pt to trial tizanidine 2 mg prescribed by rheumatologist as this may also help with her muscular tension.  Will look into physical therapy as she has had referral closed
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
## 2023-06-04 NOTE — Assessment & Plan Note (Signed)
Repeat b12 levels today  Also ordering b12 folate mma and homocysteine to confirm  Continue otc b12 1000 mcg once daily

## 2023-06-15 LAB — HOMOCYSTEINE: Homocysteine: 12.7 umol/L — ABNORMAL HIGH (ref <10.4)

## 2023-06-15 LAB — METHYLMALONIC ACID(MMA), RND URINE
Creatinine: 11.96 mmol/L (ref 1.77–23.31)
Methylmalonic acid: 0.4 mmol/mol{creat} (ref 0.3–2.2)

## 2023-06-22 ENCOUNTER — Other Ambulatory Visit: Payer: Self-pay | Admitting: Family

## 2023-06-22 ENCOUNTER — Encounter: Payer: Self-pay | Admitting: *Deleted

## 2023-06-22 DIAGNOSIS — Z87828 Personal history of other (healed) physical injury and trauma: Secondary | ICD-10-CM

## 2023-06-22 DIAGNOSIS — M542 Cervicalgia: Secondary | ICD-10-CM

## 2023-06-22 DIAGNOSIS — M549 Dorsalgia, unspecified: Secondary | ICD-10-CM

## 2023-10-16 ENCOUNTER — Ambulatory Visit: Payer: Medicaid Other | Admitting: Family Medicine

## 2024-07-12 ENCOUNTER — Encounter: Payer: Self-pay | Admitting: Family

## 2024-07-12 ENCOUNTER — Ambulatory Visit: Admitting: Family

## 2024-07-12 VITALS — BP 100/70 | HR 72 | Temp 98.4°F | Ht 63.0 in | Wt 144.0 lb

## 2024-07-12 DIAGNOSIS — E538 Deficiency of other specified B group vitamins: Secondary | ICD-10-CM | POA: Diagnosis not present

## 2024-07-12 DIAGNOSIS — J4520 Mild intermittent asthma, uncomplicated: Secondary | ICD-10-CM | POA: Diagnosis not present

## 2024-07-12 DIAGNOSIS — Z13220 Encounter for screening for lipoid disorders: Secondary | ICD-10-CM

## 2024-07-12 DIAGNOSIS — E039 Hypothyroidism, unspecified: Secondary | ICD-10-CM

## 2024-07-12 DIAGNOSIS — K640 First degree hemorrhoids: Secondary | ICD-10-CM

## 2024-07-12 DIAGNOSIS — Z0001 Encounter for general adult medical examination with abnormal findings: Secondary | ICD-10-CM

## 2024-07-12 DIAGNOSIS — R232 Flushing: Secondary | ICD-10-CM | POA: Diagnosis not present

## 2024-07-12 DIAGNOSIS — R569 Unspecified convulsions: Secondary | ICD-10-CM

## 2024-07-12 DIAGNOSIS — Z0000 Encounter for general adult medical examination without abnormal findings: Secondary | ICD-10-CM | POA: Insufficient documentation

## 2024-07-12 DIAGNOSIS — L309 Dermatitis, unspecified: Secondary | ICD-10-CM | POA: Diagnosis not present

## 2024-07-12 DIAGNOSIS — R768 Other specified abnormal immunological findings in serum: Secondary | ICD-10-CM

## 2024-07-12 LAB — VITAMIN B12: Vitamin B-12: 449 pg/mL (ref 211–911)

## 2024-07-12 LAB — LIPID PANEL
Cholesterol: 156 mg/dL (ref 0–200)
HDL: 45 mg/dL (ref >39.00)
LDL Cholesterol: 81 mg/dL (ref 0–99)
NonHDL: 110.74
Total CHOL/HDL Ratio: 3
Triglycerides: 147 mg/dL (ref 0.0–149.0)
VLDL: 29.4 mg/dL (ref 0.0–40.0)

## 2024-07-12 LAB — FOLLICLE STIMULATING HORMONE: FSH: 3.2 m[IU]/mL

## 2024-07-12 LAB — LUTEINIZING HORMONE: LH: 1.15 m[IU]/mL

## 2024-07-12 MED ORDER — TRIAMCINOLONE ACETONIDE 0.1 % EX CREA: 1.0000 | TOPICAL_CREAM | Freq: Two times a day (BID) | CUTANEOUS | 0 refills | Status: AC

## 2024-07-12 MED ORDER — HYDROCORTISONE (PERIANAL) 2.5 % EX CREA: 1.0000 | TOPICAL_CREAM | Freq: Two times a day (BID) | CUTANEOUS | 1 refills | Status: AC

## 2024-07-12 MED ORDER — ALBUTEROL SULFATE HFA 108 (90 BASE) MCG/ACT IN AERS: 2.0000 | INHALATION_SPRAY | Freq: Four times a day (QID) | RESPIRATORY_TRACT | 0 refills | Status: AC | PRN

## 2024-07-12 NOTE — Assessment & Plan Note (Signed)
Cont f/u with neurology

## 2024-07-12 NOTE — Assessment & Plan Note (Signed)
 Continue levothryoxine 112 mcg once daily.

## 2024-07-12 NOTE — Progress Notes (Signed)
 Subjective:  Patient ID: Holly Middleton, female    DOB: [birth year]  Age: 38 y.o. MRN: [id number]  Patient Care Team: Corwin Antu, FNP as PCP - General (Family Medicine) Lane Arthea BRAVO, MD as Referring Physician (Neurology) Unk Corinn Skiff, MD as Consulting Physician (Gastroenterology) Damian Therisa HERO, MD as Physician Assistant (Endocrinology)   CC:  Chief Complaint  Patient presents with   Annual Exam    Here with mother.    HPI Holly Middleton is a 38 y.o. female who presents today for an annual physical exam. She reports consuming a general diet. Golfing and active She generally feels well. She reports sleeping well. She does not have additional problems to discuss today.   Vision:Within last year Dental:Receives regular dental care  Last pap: total hysterectomy   pt is with acute concerns.   She states she thinks she is going through menopause she has been having more hot flashes lately with random mood swings and more emotional.   Hypothyroid: doing well.   Seizures: doing well on keppra    Sjogrens syndrome, still following with rheumatologist with inflammatory arthritis. Started therapy with methotrexate 10 mg weekly she is advised to f/u in six weeks however she states she did not start the methotrexate. She states she had tried folic acid  prior to initiation and diarrhea was all of the time.   Advanced Directives Patient does not have advanced directives     DEPRESSION SCREENING    07/12/2024    9:35 AM 06/04/2023   11:18 AM 02/23/2023   12:23 PM 03/13/2022    1:33 PM 08/01/2021    3:09 PM 11/23/2019    1:40 PM 01/01/2018    2:34 PM  PHQ 2/9 Scores  PHQ - 2 Score 0  0 0 2 0 2  PHQ- 9 Score   0 0 7 2 9   Exception Documentation  Patient refusal          ROS: Negative unless specifically indicated above in HPI.    Current Outpatient Medications:    clonazePAM (KLONOPIN) 0.5 MG tablet, Take 1/2 tab daily as needed for anxiety, Disp: , Rfl:    divalproex   (DEPAKOTE ) 500 MG DR tablet, Take 500 mg by mouth 2 (two) times daily. , Disp: , Rfl:    levETIRAcetam  (KEPPRA ) 750 MG tablet, Take 1 tablet (750 mg total) by mouth 2 (two) times daily., Disp: 60 tablet, Rfl: 0   levothyroxine  (SYNTHROID ) 112 MCG tablet, Take 1 tablet (112 mcg total) by mouth daily before breakfast., Disp: 90 tablet, Rfl: 0   tizanidine (ZANAFLEX) 2 MG capsule, Take 2 mg by mouth 3 (three) times daily., Disp: , Rfl:    albuterol  (VENTOLIN  HFA) 108 (90 Base) MCG/ACT inhaler, Inhale 2 puffs into the lungs every 6 (six) hours as needed for wheezing or shortness of breath., Disp: 8 g, Rfl: 0   hydrocortisone  (ANUSOL -HC) 2.5 % rectal cream, Place 1 Application rectally 2 (two) times daily., Disp: 30 g, Rfl: 1   triamcinolone  cream (KENALOG ) 0.1 %, Apply 1 Application topically 2 (two) times daily., Disp: 30 g, Rfl: 0    Objective:    BP 100/70 (BP Location: Left Arm, Patient Position: Sitting, Cuff Size: Normal)   Pulse 72   Temp 98.4 F (36.9 C) (Oral)   Ht 5' 3 (1.6 m)   Wt 144 lb (65.3 kg)   LMP  (LMP Unknown)   SpO2 98%   BMI 25.51 kg/m   BP Readings from Last 3  Encounters:  07/12/24 100/70  06/04/23 120/72  04/16/23 92/60      Physical Exam Constitutional:      General: She is not in acute distress.    Appearance: Normal appearance. She is normal weight. She is not ill-appearing.  HENT:     Head: Normocephalic.     Right Ear: Tympanic membrane normal.     Left Ear: Tympanic membrane normal.     Nose: Nose normal.     Mouth/Throat:     Mouth: Mucous membranes are moist.  Eyes:     Extraocular Movements: Extraocular movements intact.     Pupils: Pupils are equal, round, and reactive to light.  Cardiovascular:     Rate and Rhythm: Normal rate and regular rhythm.  Pulmonary:     Effort: Pulmonary effort is normal.     Breath sounds: Normal breath sounds.  Abdominal:     General: Abdomen is flat. Bowel sounds are normal.     Palpations: Abdomen is soft.      Tenderness: There is no guarding or rebound.  Musculoskeletal:        General: Normal range of motion.     Cervical back: Normal range of motion.  Skin:    General: Skin is warm.     Capillary Refill: Capillary refill takes less than 2 seconds.  Neurological:     General: No focal deficit present.     Mental Status: She is alert.  Psychiatric:        Mood and Affect: Mood normal.        Behavior: Behavior normal.        Thought Content: Thought content normal.        Judgment: Judgment normal.          Assessment & Plan:  Low serum vitamin B12 Assessment & Plan: Ordered b12 pending results   Orders: -     Vitamin B12  Mild intermittent extrinsic asthma without complication -     Albuterol  Sulfate HFA; Inhale 2 puffs into the lungs every 6 (six) hours as needed for wheezing or shortness of breath.  Dispense: 8 g; Refill: 0  Grade I hemorrhoids -     Hydrocortisone  (Perianal); Place 1 Application rectally 2 (two) times daily.  Dispense: 30 g; Refill: 1  Eczema, unspecified type -     Triamcinolone  Acetonide; Apply 1 Application topically 2 (two) times daily.  Dispense: 30 g; Refill: 0  Screening for lipoid disorders -     Lipid panel  Encounter for general adult medical examination without abnormal findings Assessment & Plan: Patient Counseling(The following topics were reviewed):  Preventative care handout given to pt  Health maintenance and immunizations reviewed. Please refer to Health maintenance section. Pt advised on safe sex, wearing seatbelts in car, and proper nutrition labwork ordered today for annual Dental health: Discussed importance of regular tooth brushing, flossing, and dental visits.   Orders: -     Vitamin B12 -     Lipid panel  Hot flashes Assessment & Plan: LH and FSH ordered pending results.  Could be possible total hysterectomy   Orders: -     Follicle stimulating hormone -     Luteinizing hormone  Hypothyroidism, unspecified  type Assessment & Plan: Continue levothryoxine 112 mcg once daily.     Seizures (HCC) Assessment & Plan: Cont f/u with neurology    Positive ANA (antinuclear antibody) Assessment & Plan: Cont f/u with rheumatology  Did advise her to advise her rheumatologist that  she decided not to start her methotrexate       Follow-up: Return in about 1 year (around 07/12/2025) for f/u CPE.   Ginger Patrick, FNP

## 2024-07-12 NOTE — Assessment & Plan Note (Signed)

## 2024-07-12 NOTE — Assessment & Plan Note (Signed)
 LH and FSH ordered pending results.  Could be possible total hysterectomy

## 2024-07-12 NOTE — Assessment & Plan Note (Signed)
 Cont f/u with rheumatology  Did advise her to advise her rheumatologist that she decided not to start her methotrexate

## 2024-07-12 NOTE — Assessment & Plan Note (Signed)
 Ordered b12 pending results

## 2024-07-13 ENCOUNTER — Ambulatory Visit: Payer: Self-pay | Admitting: Family

## 2024-07-13 NOTE — Telephone Encounter (Signed)
 Copied from CRM [id number]. Topic: Referral - Request for Referral >> Jul 13, 2024 10:37 AM Chiquita SQUIBB wrote: Did the patient discuss referral with their provider in the last year? Yes (If No - schedule appointment) (If Yes - send message)  Appointment offered? No  Type of order/referral and detailed reason for visit: Mental Health provider   Preference of office, provider, location: [address], in [place] [address]   If referral order, have you been seen by this specialty before? Yes- Patient has been admitted to a mental health facility in the past.  (If Yes, this issue or another issue? When? Where?  Can we respond through MyChart? Yes

## 2024-07-18 NOTE — Telephone Encounter (Signed)
 Can we please schedule a video visit to evaluate further and document need for the referral?    I want to talk through her past inpatient experience and determine which route we need to take.   Can be video visit since she was just seen In the office.

## 2024-07-27 ENCOUNTER — Telehealth: Admitting: Family

## 2024-08-23 ENCOUNTER — Other Ambulatory Visit: Payer: Self-pay | Admitting: Internal Medicine

## 2024-08-23 ENCOUNTER — Ambulatory Visit: Payer: Self-pay

## 2024-08-23 ENCOUNTER — Ambulatory Visit (INDEPENDENT_AMBULATORY_CARE_PROVIDER_SITE_OTHER): Admitting: Internal Medicine

## 2024-08-23 ENCOUNTER — Encounter: Payer: Self-pay | Admitting: Internal Medicine

## 2024-08-23 VITALS — BP 98/68 | HR 61 | Temp 98.4°F | Ht 63.0 in | Wt 143.0 lb

## 2024-08-23 DIAGNOSIS — B359 Dermatophytosis, unspecified: Secondary | ICD-10-CM | POA: Insufficient documentation

## 2024-08-23 MED ORDER — KETOCONAZOLE 2 % EX CREA: 1.0000 | TOPICAL_CREAM | Freq: Two times a day (BID) | CUTANEOUS | 1 refills | Status: DC | PRN

## 2024-08-23 NOTE — Telephone Encounter (Signed)
 NOTED agree with plan
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
## 2024-08-23 NOTE — Assessment & Plan Note (Signed)
 Discussed etiology Will give ketoconazole  cream to try If not improving by next week---would add fluconazole 100mg  daily for a week (or 150mg  every 3 days)

## 2024-08-23 NOTE — Telephone Encounter (Signed)
 FYI Only or Action Required?: Pt has a rash/dry areas on hip and panty line. No appts available today. Leaving for vacation tomorrow morning.   Patient was last seen in primary care on 07/12/2024 by Corwin Antu, FNP.  Called Nurse Triage reporting Rash.  Symptoms began before 07/12/2024.  Interventions attempted: Prescription medications: Steroid cream.  Symptoms are: unchanged.  Triage Disposition: See PCP When Office is Open (Within 3 Days)  Patient/caregiver understands and will follow disposition?: Yes                   Copied from CRM #[id number]. Topic: Clinical - Red Word Triage >> Aug 23, 2024  8:43 AM Frederich PARAS wrote: [facility] that prompted transfer to Nurse Triage: pain, allegic reaction   allergic reaction, rash, on side of her hip, spreading, partial hip and partial butt cheek, dry skin, itchiness, dry painful, bumps , about 2 weeks she have had this. Not really read but its pinkish. Reason for Disposition  [1] Severe localized itching AND [2] after 2 days of steroid cream  Answer Assessment - Initial Assessment Questions 1. CALLER DIAGNOSIS: What do you think is causing the rash? (e.g., athlete's foot, chickenpox, hives, impetigo)     Unsure 2. LOCALIZED OR WIDESPREAD:  Is the rash all over (widespread) or mostly just in one area of the body (localized)?      Just one area, where panties elastic is touching body and on buttocks,  3. NEW MEDICINES: Are you taking any new medicine?     no 4. APPEARANCE of RASH: What does the rash look like? What color is it?  Note: It is difficult to assess rash color in people with darker-colored skin. When this situation occurs, simply ask the caller to describe what they see.     Dry areas - has hx of eczema 5. FEVER: Do you have a fever? If Yes, ask: What is your temperature, how was it measured, and when did it start?     no 6. PREGNANCY: Is there any chance you are pregnant? When was your last menstrual  period?     no  Protocols used: Rash - Guideline Selection-A-AH, Rash or Redness - Localized-A-AH

## 2024-08-23 NOTE — Telephone Encounter (Signed)
 FYI Only or Action Required?: FYI only for provider.  Patient was last seen in primary care on 08/23/2024 by Holly Charlie FERNS, MD.  Called Nurse Triage reporting Seizures.  Symptoms began today.  Interventions attempted: Nothing.  Symptoms are: stable.  Triage Disposition: See HCP Within 4 Hours (Or PCP Triage)  Patient/caregiver understands and will follow disposition?: YesCopied from CRM #[id number]. Topic: Clinical - Red Word Triage >> Aug 23, 2024  1:04 PM Mia F wrote: Red Word that prompted transfer to Nurse Triage: Pt had a seizure in the middle of the store. Mom could not elaborate any further. Warm transfer to CAL Reason for Disposition  Not on or ran out of antiseizure medicine (anticonvulsant)    Missed medication dose this morning  Answer Assessment - Initial Assessment Questions Patient's mother Sebastian called stating a seizure just occurred, missed medication dose this morning. Pt is conscious and did not sustain any injuries during seizure. Store employee called 911. Pt's mother looking for guidance, reassured EMS will arrive and further evaluate and decide proper plan of care. Advised to call back if has any other questions or concerns.  1. ONSET: When did the seizure occur?     A few minutes ago 2. DURATION: How long did the seizure last (or how long has it been happening)? (e.g., seconds, minutes)  Note: Most seizures last less than 5 minutes.     A few seconds, unsure 3. DESCRIPTION: Describe what happened during the seizure. Did the body become stiff? Was there any jerking?  Did they lose consciousness during the seizure?     Remained conscious, was unable to talk, stiff, shaking 4. CIRCUMSTANCE: What was the person doing when the seizure began?      In [employer] 5. MENTAL STATUS AFTER SEIZURE: Does the person seem more groggy or sleepy? Does the person know who they are, who you are, and where they are now?      Yes, regained ability to talk shortly  after. 6. PRIOR SEIZURES: Has the person had a seizure (convulsion) before? (e.g., epilepsy, other cause)  If Yes, ask: When was the last time? and What happened last time?      Yes 7. EPILEPSY: Does the person have epilepsy? Note: Check for medical ID bracelet.     Yes 8. MEDICINES: Does the person take anticonvulsant medications? (e.g., Yes, No; missed doses, any recent changes)     Missed 7am dose today.  9. INJURY: Was the person hurt or injured during the seizure? (e.g., hit their head, bit their tongue)     No  Protocols used: [employer]

## 2024-08-23 NOTE — Telephone Encounter (Signed)
 Pharmacy comment: ins requires grams per application and location.

## 2024-08-23 NOTE — Telephone Encounter (Signed)
 Spoke with pt's mother, Sebastian. Pt has been scheduled to see Dr. Jimmy today at 1045.

## 2024-08-24 MED ORDER — KETOCONAZOLE 2 % EX CREA: TOPICAL_CREAM | CUTANEOUS | 1 refills | Status: AC

## 2024-08-24 NOTE — Addendum Note (Signed)
 Addended by: KALLIE CLOTILDA SQUIBB on: 08/24/2024 07:43 AM   Modules accepted: Orders

## 2024-09-01 ENCOUNTER — Inpatient Hospital Stay: Admitting: Family

## 2024-09-13 ENCOUNTER — Telehealth: Payer: Self-pay | Admitting: Family

## 2024-09-13 NOTE — Telephone Encounter (Signed)
 Copied from CRM [id number]. Topic: Referral - Request for Referral >> Sep 13, 2024  9:39 AM Ahlexyia S wrote: Did the patient discuss referral with their provider in the last year? Yes (If No - schedule appointment) (If Yes - send message)  Appointment offered? No  Type of order/referral and detailed reason for visit: Pt having issues with her bowel.  Preference of office, provider, location:  Ruel Kung, MD, MRCP ([place]) Gastroenterologist [facility] - Gastroenterology [address] [place], [place] [address] Phone: [phone]  If referral order, have you been seen by this specialty before? Yes (If Yes, this issue or another issue? When? Where?  Can we respond through MyChart? Yes

## 2024-09-13 NOTE — Telephone Encounter (Signed)
 She hasn't been seen by GI since 2022 and we haven't discussed will need appt for documentation for referral

## 2024-09-16 NOTE — Telephone Encounter (Signed)
Called and schedule pt

## 2024-09-21 ENCOUNTER — Encounter: Payer: Self-pay | Admitting: Family

## 2024-09-21 ENCOUNTER — Telehealth: Payer: Self-pay | Admitting: Family

## 2024-09-21 ENCOUNTER — Ambulatory Visit (INDEPENDENT_AMBULATORY_CARE_PROVIDER_SITE_OTHER): Admitting: Family

## 2024-09-21 VITALS — BP 110/62 | HR 65 | Temp 98.6°F | Ht 63.0 in | Wt 142.0 lb

## 2024-09-21 DIAGNOSIS — R1013 Epigastric pain: Secondary | ICD-10-CM | POA: Insufficient documentation

## 2024-09-21 DIAGNOSIS — R102 Pelvic and perineal pain: Secondary | ICD-10-CM

## 2024-09-21 DIAGNOSIS — R1012 Left upper quadrant pain: Secondary | ICD-10-CM | POA: Diagnosis not present

## 2024-09-21 DIAGNOSIS — K580 Irritable bowel syndrome with diarrhea: Secondary | ICD-10-CM | POA: Diagnosis not present

## 2024-09-21 DIAGNOSIS — R822 Biliuria: Secondary | ICD-10-CM | POA: Diagnosis not present

## 2024-09-21 DIAGNOSIS — R197 Diarrhea, unspecified: Secondary | ICD-10-CM | POA: Diagnosis not present

## 2024-09-21 DIAGNOSIS — R1032 Left lower quadrant pain: Secondary | ICD-10-CM

## 2024-09-21 DIAGNOSIS — K219 Gastro-esophageal reflux disease without esophagitis: Secondary | ICD-10-CM | POA: Insufficient documentation

## 2024-09-21 LAB — COMPREHENSIVE METABOLIC PANEL WITH GFR
ALT: 9 U/L (ref 0–35)
AST: 13 U/L (ref 0–37)
Albumin: 4.6 g/dL (ref 3.5–5.2)
Alkaline Phosphatase: 40 U/L (ref 39–117)
BUN: 7 mg/dL (ref 6–23)
CO2: 30 meq/L (ref 19–32)
Calcium: 9.7 mg/dL (ref 8.4–10.5)
Chloride: 96 meq/L (ref 96–112)
Creatinine, Ser: 0.61 mg/dL (ref 0.40–1.20)
GFR: 113.09 mL/min (ref >60.00)
Glucose, Bld: 80 mg/dL (ref 70–99)
Potassium: 4 meq/L (ref 3.5–5.1)
Sodium: 133 meq/L — ABNORMAL LOW (ref 135–145)
Total Bilirubin: 0.4 mg/dL (ref 0.2–1.2)
Total Protein: 7.5 g/dL (ref 6.0–8.3)

## 2024-09-21 LAB — URINALYSIS, ROUTINE W REFLEX MICROSCOPIC
Bilirubin Urine: NEGATIVE
Hgb urine dipstick: NEGATIVE
Ketones, ur: NEGATIVE
Leukocytes,Ua: NEGATIVE
Nitrite: NEGATIVE
RBC / HPF: NONE SEEN (ref 0–?)
Specific Gravity, Urine: 1.015 (ref 1.000–1.030)
Total Protein, Urine: NEGATIVE
Urine Glucose: NEGATIVE
Urobilinogen, UA: 0.2 (ref 0.0–1.0)
WBC, UA: NONE SEEN (ref 0–?)
pH: 6 (ref 5.0–8.0)

## 2024-09-21 LAB — CBC WITH DIFFERENTIAL/PLATELET
Basophils Absolute: 0 K/uL (ref 0.0–0.1)
Basophils Relative: 0.3 % (ref 0.0–3.0)
Eosinophils Absolute: 0 K/uL (ref 0.0–0.7)
Eosinophils Relative: 0.3 % (ref 0.0–5.0)
HCT: 40.7 % (ref 36.0–46.0)
Hemoglobin: 13.9 g/dL (ref 12.0–15.0)
Lymphocytes Relative: 31.1 % (ref 12.0–46.0)
Lymphs Abs: 1.8 K/uL (ref 0.7–4.0)
MCHC: 34.1 g/dL (ref 30.0–36.0)
MCV: 97 fl (ref 78.0–100.0)
Monocytes Absolute: 0.3 K/uL (ref 0.1–1.0)
Monocytes Relative: 5.7 % (ref 3.0–12.0)
Neutro Abs: 3.5 K/uL (ref 1.4–7.7)
Neutrophils Relative %: 62.6 % (ref 43.0–77.0)
Platelets: 199 K/uL (ref 150.0–400.0)
RBC: 4.2 Mil/uL (ref 3.87–5.11)
RDW: 12.7 % (ref 11.5–15.5)
WBC: 5.7 K/uL (ref 4.0–10.5)

## 2024-09-21 LAB — POCT URINALYSIS DIP (CLINITEK)
Bilirubin, UA: NEGATIVE
Blood, UA: NEGATIVE
Glucose, UA: NEGATIVE mg/dL
Ketones, POC UA: NEGATIVE mg/dL
Leukocytes, UA: NEGATIVE
Nitrite, UA: NEGATIVE
POC PROTEIN,UA: NEGATIVE
Spec Grav, UA: 1.01 (ref 1.010–1.025)
Urobilinogen, UA: 4 U/dL — AB
pH, UA: 6 (ref 5.0–8.0)

## 2024-09-21 NOTE — Assessment & Plan Note (Signed)
 Amylase lipase today pending  Cmp ordered pending Painful on initial palpation but fleeting pain so suspect gaseous

## 2024-09-21 NOTE — Assessment & Plan Note (Signed)
 With gerd as well  H pylori and alpha gal ordered pending results.  Ddx ulceration vs gerd vs cholecystitis

## 2024-09-21 NOTE — Telephone Encounter (Signed)
 Can we send order for incontinence supplies with depends and wipes
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
## 2024-09-21 NOTE — Telephone Encounter (Signed)
 Pt came to the counter and stated provider would like for a telephone encounter to be sent with incontinence supplies.

## 2024-09-21 NOTE — Telephone Encounter (Signed)
 Copied from CRM #[id number]. Topic: Clinical - Order For Equipment >> Sep 21, 2024 11:40 AM Donna BRAVO wrote: Reason for CRM: [facility] calling from [facility] fax  number for  store is fax [phone]  please send prescription for medical supplies to the fax.   They are waiting for the prescription to be sent.

## 2024-09-21 NOTE — Assessment & Plan Note (Addendum)
 Stool cultures ordered Celiac panel ordered Discussed low fodmap and dietary recommendations Immodium prn  Advised long term use pepto bismal may cause blackening of the stool  Increase water intake.   Chronic, referral placed for eval/treat with GI as well

## 2024-09-21 NOTE — Assessment & Plan Note (Signed)
 R/o h pylori and alpha gal  Limit NSAID use  Could consider famotadine Try to decrease and or avoid spicy foods, fried fatty foods, and also caffeine and chocolate as these can increase heartburn symptoms.

## 2024-09-21 NOTE — Addendum Note (Signed)
 Addended by: SEBASTIAN DANNA GRADE on: 09/21/2024 12:55 PM   Modules accepted: Orders

## 2024-09-22 ENCOUNTER — Other Ambulatory Visit

## 2024-09-22 DIAGNOSIS — R1013 Epigastric pain: Secondary | ICD-10-CM

## 2024-09-22 DIAGNOSIS — K580 Irritable bowel syndrome with diarrhea: Secondary | ICD-10-CM

## 2024-09-22 DIAGNOSIS — R197 Diarrhea, unspecified: Secondary | ICD-10-CM

## 2024-09-22 LAB — H. PYLORI BREATH TEST: H. pylori Breath Test: NOT DETECTED

## 2024-09-22 MED ORDER — INCONTINENCE SUPPLY DISPOSABLE MISC: 3 refills | Status: AC

## 2024-09-22 MED ORDER — INCONTINENCE SUPPLIES MISC: 3 refills | Status: AC

## 2024-09-22 MED ORDER — INCONTINENCE SUPPLY DISPOSABLE MISC: 3 refills | Status: DC

## 2024-09-22 NOTE — Telephone Encounter (Signed)
 Orders have been faxed

## 2024-09-22 NOTE — Telephone Encounter (Signed)
 NOTED I had already opened a telephone encounter with this.

## 2024-09-23 ENCOUNTER — Ambulatory Visit: Payer: Self-pay | Admitting: Family

## 2024-09-23 DIAGNOSIS — Z91018 Allergy to other foods: Secondary | ICD-10-CM

## 2024-09-23 DIAGNOSIS — T7819XA Other adverse food reactions, not elsewhere classified, initial encounter: Secondary | ICD-10-CM

## 2024-09-23 LAB — C. DIFFICILE GDH AND TOXIN A/B
GDH ANTIGEN: NOT DETECTED
MICRO NUMBER:: 17017082
SPECIMEN QUALITY:: ADEQUATE
TOXIN A AND B: NOT DETECTED

## 2024-09-27 LAB — GIARDIA ANTIGEN
MICRO NUMBER:: 17017023
RESULT:: NOT DETECTED
SPECIMEN QUALITY:: ADEQUATE

## 2024-09-27 LAB — GASTROINTESTINAL PATHOGEN PNL
CampyloBacter Group: NOT DETECTED
Norovirus GI/GII: NOT DETECTED
Rotavirus A: NOT DETECTED
Salmonella species: NOT DETECTED
Shiga Toxin 1: NOT DETECTED
Shiga Toxin 2: NOT DETECTED
Shigella Species: NOT DETECTED
Vibrio Group: NOT DETECTED
Yersinia enterocolitica: NOT DETECTED

## 2024-09-28 ENCOUNTER — Encounter: Payer: Self-pay | Admitting: Family

## 2024-09-28 ENCOUNTER — Ambulatory Visit (INDEPENDENT_AMBULATORY_CARE_PROVIDER_SITE_OTHER): Payer: Self-pay | Admitting: Family

## 2024-09-28 VITALS — BP 122/80 | HR 68 | Temp 98.1°F | Ht 63.0 in | Wt 140.4 lb

## 2024-09-28 DIAGNOSIS — N393 Stress incontinence (female) (male): Secondary | ICD-10-CM | POA: Diagnosis not present

## 2024-09-28 DIAGNOSIS — B86 Scabies: Secondary | ICD-10-CM | POA: Diagnosis not present

## 2024-09-28 DIAGNOSIS — Z91018 Allergy to other foods: Secondary | ICD-10-CM | POA: Insufficient documentation

## 2024-09-28 LAB — CELIAC DISEASE PANEL
(tTG) Ab, IgA: <1 U/mL
(tTG) Ab, IgG: <1 U/mL
Gliadin IgA: <1 U/mL
Gliadin IgG: <1 U/mL
Immunoglobulin A: 239 mg/dL (ref 47–310)

## 2024-09-28 LAB — EGG COMPONENT PANEL REFLEX
Allergen, Ovalbumin, f232: <0.1 kU/L
Allergen, Ovomucoid, f233: 0.12 kU/L — ABNORMAL HIGH
CLASS: 0

## 2024-09-28 LAB — ALPHA-GAL PANEL
Allergen, Pork, f26: <0.1 kU/L — AB
Beef: <0.1 kU/L
CLASS: 0
Class: 0
GALACTOSE-ALPHA-1,3-GALACTOSE IGE*: <0.1 kU/L (ref <0.10)
GALACTOSE-ALPHA-1,3-GALACTOSE IGE*: <0.1 kU/L — AB (ref <0.10)

## 2024-09-28 LAB — FOOD ALLERGY PROFILE W/ REFLEXES
Allergen, Salmon, f41: <0.1 kU/L
Almonds: 0.2 kU/L — ABNORMAL HIGH
CLASS: 0
CLASS: 0
CLASS: 0
CLASS: 0
CLASS: 1
CLASS: 1
CLASS: 1
Cashew IgE: <0.1 kU/L
Class: 3
Egg White IgE: 3.5 kU/L — ABNORMAL HIGH
Fish Cod: <0.1 kU/L
Hazelnut: 0.43 kU/L — ABNORMAL HIGH
Milk IgE: 0.32 kU/L — ABNORMAL HIGH
Peanut IgE: 0.32 kU/L — ABNORMAL HIGH
Scallop IgE: 0.2 kU/L — ABNORMAL HIGH
Sesame Seed f10: 0.4 kU/L — ABNORMAL HIGH
Shrimp IgE: 0.2 kU/L — ABNORMAL HIGH
Soybean IgE: 0.19 kU/L — ABNORMAL HIGH
Tuna IgE: <0.1 kU/L
Walnut: 0.19 kU/L — ABNORMAL HIGH
Wheat IgE: 0.43 kU/L — ABNORMAL HIGH

## 2024-09-28 LAB — MILK COMPONENT PANEL RFLX
Allergen, Alpha-lactalb,f76: <0.1 kU/L
Allergen, Beta-lactoglob,f77: 0.29 kU/L — ABNORMAL HIGH
Allergen, Casein, f78: <0.1 kU/L
CLASS: 0
CLASS: 0

## 2024-09-28 LAB — INTERPRETATION:

## 2024-09-28 LAB — PEANUT COMPONENT PANEL REFLEX
Ara h 1 (f422): <0.1 kU/L (ref <0.10)
Ara h 2 (f423): <0.1 kU/L (ref <0.10)
Ara h 3 (f424): <0.1 kU/L (ref <0.10)
Ara h 8 (f352): <0.1 kU/L (ref <0.10)
Ara h 9 (f427: <0.1 kU/L (ref <0.10)
F447-IgE Ara h 6: <0.1 kU/L (ref <0.10)

## 2024-09-28 MED ORDER — PERMETHRIN 5 % EX CREA: TOPICAL_CREAM | CUTANEOUS | 0 refills | Status: AC

## 2024-09-28 NOTE — Progress Notes (Signed)
 Established Patient Office Visit  Subjective:      CC:  Chief Complaint  Patient presents with   Follow-up    HPI: Holly Middleton is a 38 y.o. female presenting on 09/28/2024 for Follow-up .  Discussed the use of AI scribe software for clinical note transcription with the patient, who gave verbal consent to proceed.  History of Present Illness Holly Middleton is a 38 year old female who presents with severe itching and rash following a recent trip. Her mother is also experiencing similar symptoms.  She developed severe itching and a rash after a family trip to [place] [employer] camps[place] in [place], where she was bitten by bed bugs. The bites are located on her face, neck, stomach, webbing of fingers, and thighs. The itching is intense and persistent, and over-the-counter anti-itch creams have not provided relief. The symptoms began on Saturday, and her mother and niece are experiencing similar symptoms.  She has not consulted a urologist in years but uses two diapers per day for incontinence management. A recent change in health insurance has affected her supply of incontinence products.  She is currently on an antibiotic for an abscess on her left thigh, which developed after noticing a 'big sack' on her thigh. She has a history of recurrent pimples in her panty line, and the current infection is improving with the antibiotic treatment. She is taking the antibiotic twice daily for five more days. She is unsure of which antibiotic this is, we do not have records  She has a history of eczema and ringworm, initially suspecting these as the cause of a patch on her skin. She has not been taking regular antihistamines for itching but has used Claritin  in the past.         Social history:  Relevant past medical, surgical, family and social history reviewed and updated as indicated. Interim medical history since our last visit reviewed.  Allergies and medications reviewed and updated.  DATA  REVIEWED: CHART IN EPIC     ROS: Negative unless specifically indicated above in HPI.    Current Outpatient Medications:    albuterol  (VENTOLIN  HFA) 108 (90 Base) MCG/ACT inhaler, Inhale 2 puffs into the lungs every 6 (six) hours as needed for wheezing or shortness of breath., Disp: 8 g, Rfl: 0   clonazePAM (KLONOPIN) 0.5 MG tablet, Take 1/2 tab daily as needed for anxiety, Disp: , Rfl:    cyanocobalamin  (VITAMIN B12) 1000 MCG tablet, Take 1,000 mcg by mouth., Disp: , Rfl:    divalproex  (DEPAKOTE ) 500 MG DR tablet, Take 500 mg by mouth 2 (two) times daily. , Disp: , Rfl:    hydrocortisone  (ANUSOL -HC) 2.5 % rectal cream, Place 1 Application rectally 2 (two) times daily., Disp: 30 g, Rfl: 1   Incontinence Supplies MISC, Use one nightly as needed, Disp: 100 each, Rfl: 3   Incontinence Supply Disposable MISC, Use one nightly at bedtime as needed, Disp: 100 each, Rfl: 3   ketoconazole  (NIZORAL ) 2 % cream, 0.5-1 gram per dose to rash on back, Disp: 60 g, Rfl: 1   levETIRAcetam  (KEPPRA ) 750 MG tablet, Take 1 tablet (750 mg total) by mouth 2 (two) times daily., Disp: 60 tablet, Rfl: 0   levothyroxine  (SYNTHROID ) 112 MCG tablet, Take 1 tablet (112 mcg total) by mouth daily before breakfast., Disp: 90 tablet, Rfl: 0   permethrin (ELIMITE) 5 % cream, Apply to entire skin from chin down to and including toes under fingernails and toenails. Leave on  8-14 hours. Repeat in 1-2 weeks. Reapply to hands after washing., Disp: 30 g, Rfl: 0   triamcinolone  cream (KENALOG ) 0.1 %, Apply 1 Application topically 2 (two) times daily., Disp: 30 g, Rfl: 0        Objective:        BP 122/80 (BP Location: Left Arm, Patient Position: Sitting, Cuff Size: Normal)   Pulse 68   Temp 98.1 F (36.7 C) (Temporal)   Ht 5' 3 (1.6 m)   Wt 140 lb 6.4 oz (63.7 kg)   LMP  (LMP Unknown)   SpO2 98%   BMI 24.87 kg/m   Physical Exam SKIN: Skin examination was challenging due to coloration and tattoos.  Wt Readings  from Last 3 Encounters:  09/28/24 140 lb 6.4 oz (63.7 kg)  09/21/24 142 lb (64.4 kg)  08/23/24 143 lb (64.9 kg)    Physical Exam Skin:    Comments: Multiple papular skin lesions scattered over 50% body including webbing of fingers             Results   Assessment & Plan:   Assessment and Plan Assessment & Plan Scabies Intensely itchy rash with lesions on the face, neck, stomach, and between fingers, consistent with scabies. Recent exposure to bed bugs and potential chiggers [place] camps[place]. Symptoms include intense itching and presence in webbing of fingers. Highly contagious condition. - Prescribe permethrin cream apply chin down leave on for 8-12 hours repeat again in 1-2 weeks -handout given on transmission information  - Advise starting Claritin  for itching relief. - Provide printed information on scabies management, including cleaning and avoiding skin contact. - Instruct to clean all items touched in the last three days with hot water or dry clean, and vacuum furniture and matting. - Advise avoiding direct skin contact and sharing items until treatment is complete.  Abscess of left thigh Abscess on left thigh, improving with current antibiotic treatment apply chin down leave on for . Recurrent pimples in the panty line area. Infection resolving with ongoing antibiotic therapy.  Stress urinary incontinence Stress urinary incontinence managed with two diapers per day. Recent insurance change affecting supply acquisition. - Sign supply papers for incontinence supplies, including diapers. -referral placed for urology for eval/treat as ongoing       Return if symptoms worsen or fail to improve.     Ginger Patrick, MSN, APRN, FNP-C [facility]

## 2024-10-05 DIAGNOSIS — N3941 Urge incontinence: Secondary | ICD-10-CM | POA: Diagnosis not present

## 2024-10-05 DIAGNOSIS — K580 Irritable bowel syndrome with diarrhea: Secondary | ICD-10-CM | POA: Diagnosis not present

## 2024-10-20 ENCOUNTER — Encounter: Payer: Self-pay | Admitting: Allergy & Immunology

## 2024-10-20 ENCOUNTER — Ambulatory Visit (INDEPENDENT_AMBULATORY_CARE_PROVIDER_SITE_OTHER): Admitting: Allergy & Immunology

## 2024-10-20 VITALS — BP 116/78 | HR 64 | Ht 63.0 in | Wt 141.0 lb

## 2024-10-20 DIAGNOSIS — R110 Nausea: Secondary | ICD-10-CM

## 2024-10-20 DIAGNOSIS — K529 Noninfective gastroenteritis and colitis, unspecified: Secondary | ICD-10-CM

## 2024-10-20 NOTE — Patient Instructions (Addendum)
 1. Chronic diarrhea and nausea - We placed another referral to see Dr. Ruel Kung. - We can get you scheduled for food testing (sheet provided). - I am going to get some labs to rule out some weird things like mast cell disease  - We are getting some urine studies as well to look for mast cell activation syndrome.  2. Return in about 1 week (around 10/27/2024) for FOOD TESTING. You can have the follow up appointment with Dr. Iva or a Nurse Practicioner (our Nurse Practitioners are excellent and always have Physician oversight!).    Please inform us  of any Emergency Department visits, hospitalizations, or changes in symptoms. Call us  before going to the ED for breathing or allergy symptoms since we might be able to fit you in for a sick visit. Feel free to contact us  anytime with any questions, problems, or concerns.  It was a pleasure to meet you and your family today!  Websites that have reliable patient information: 1. American Academy of Asthma, Allergy, and Immunology: www.aaaai.org 2. Food Allergy Research and Education (FARE): foodallergy.org 3. Mothers of Asthmatics: http://www.asthmacommunitynetwork.org 4. American College of Allergy, Asthma, and Immunology: www.acaai.org      "Like" us  on Facebook and Instagram for our latest updates!      A healthy democracy works best when [employer] participate! Make sure you are registered to vote! If you have moved or changed any of your contact information, you will need to get this updated before voting! Scan the QR codes below to learn more!
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
## 2024-10-23 LAB — ALLERGENS W/COMP RFLX AREA 2

## 2024-10-24 DIAGNOSIS — K529 Noninfective gastroenteritis and colitis, unspecified: Secondary | ICD-10-CM | POA: Diagnosis not present

## 2024-11-01 NOTE — Patient Instructions (Incomplete)
 1. Chronic diarrhea and nausea -She reports within 30 minutes of eating pork she will be in the bathroom - We placed another referral to see Dr. Ruel Kung at last office visit - Skin testing today is positive to pistachio, cashew, cows milk, and wheat with adequate controls. - Copy of skin testing given -Start avoiding wheat, cows milk (dairy products), tree nuts, egg and pork. In case of an allergic reaction, give Benadryl  50 mg every 6 hours, and if life-threatening symptoms occur, inject with EpiPen 0.3 mg.  Discussed that we will avoid egg and pork due to elevated lab testing done at a another office and symptoms -Demonstration given on how to use EpiPen -Emergency action plan given and reviewed - Waiting on results for mast cell activation. Will call you with results once they are all back.  2. Follow up in 2 weeks with Dr. Iva or sooner if needed    Please inform us  of any Emergency Department visits, hospitalizations, or changes in symptoms. Call us  before going to the ED for breathing or allergy symptoms since we might be able to fit you in for a sick visit. Feel free to contact us  anytime with any questions, problems, or concerns.  It was a pleasure to meet you and your family today!  Websites that have reliable patient information: 1. American Academy of Asthma, Allergy, and Immunology: www.aaaai.org 2. Food Allergy Research and Education (FARE): foodallergy.org 3. Mothers of Asthmatics: http://www.asthmacommunitynetwork.org 4. American College of Allergy, Asthma, and Immunology: www.acaai.org

## 2024-11-02 ENCOUNTER — Encounter: Payer: Self-pay | Admitting: Family

## 2024-11-02 ENCOUNTER — Ambulatory Visit: Admitting: Family

## 2024-11-02 DIAGNOSIS — K529 Noninfective gastroenteritis and colitis, unspecified: Secondary | ICD-10-CM

## 2024-11-02 DIAGNOSIS — R110 Nausea: Secondary | ICD-10-CM | POA: Diagnosis not present

## 2024-11-02 DIAGNOSIS — T7806XD Anaphylactic reaction due to food additives, subsequent encounter: Secondary | ICD-10-CM | POA: Diagnosis not present

## 2024-11-02 DIAGNOSIS — T7806XA Anaphylactic reaction due to food additives, initial encounter: Secondary | ICD-10-CM

## 2024-11-02 LAB — PROSTAGLANDIN D2, SERUM: Prostaglandin D2, serum: 51 pg/mL

## 2024-11-02 LAB — ALLERGENS W/COMP RFLX AREA 2
Alternaria Alternata IgE: <0.1 kU/L
Aspergillus Fumigatus IgE: <0.1 kU/L
Bermuda Grass IgE: 2.19 kU/L — AB
Cedar, Mountain IgE: 0.21 kU/L — AB
Cladosporium Herbarum IgE: <0.1 kU/L
Cockroach, German IgE: 0.24 kU/L — AB
Common Silver Birch IgE: 1.94 kU/L — AB
Cottonwood IgE: 1.29 kU/L — AB
D Farinae IgE: 1.14 kU/L — AB
D Pteronyssinus IgE: 1.29 kU/L — AB
E001-IgE Cat Dander: 0.16 kU/L — AB
E005-IgE Dog Dander: 0.35 kU/L — AB
Elm, American IgE: 9.2 kU/L — AB
IgE (Immunoglobulin E), Serum: 522 [IU]/mL — ABNORMAL HIGH (ref 6–495)
Johnson Grass IgE: 7.32 kU/L — AB
Maple/Box Elder IgE: 1.12 kU/L — AB
Mouse Urine IgE: 0.11 kU/L — AB
Oak, White IgE: 0.32 kU/L — AB
Pecan, Hickory IgE: 20.4 kU/L — AB
Penicillium Chrysogen IgE: <0.1 kU/L
Pigweed, Rough IgE: 1.83 kU/L — AB
Ragweed, Short IgE: 66.2 kU/L — AB
Sheep Sorrel IgE Qn: 0.79 kU/L — AB
Timothy Grass IgE: 9.47 kU/L — AB
White Mulberry IgE: 2.29 kU/L — AB

## 2024-11-02 LAB — PANEL 606648
E101-IgE Can f 1: <0.1 kU/L
E102-IgE Can f 2: <0.1 kU/L
E221-IgE Can f 3: <0.1 kU/L
E226-IgE Can f 5: <0.1 kU/L

## 2024-11-02 LAB — C-REACTIVE PROTEIN: CRP: <1 mg/L (ref 0–10)

## 2024-11-02 LAB — ALLERGEN COMPONENT COMMENTS

## 2024-11-02 LAB — TRYPTASE: Tryptase: 2 ug/L — ABNORMAL LOW (ref 2.2–13.2)

## 2024-11-02 LAB — SEDIMENTATION RATE: Sed Rate: 6 mm/h (ref 0–32)

## 2024-11-02 MED ORDER — EPINEPHRINE 0.3 MG/0.3ML IJ SOAJ: 0.3000 mg | INTRAMUSCULAR | 1 refills | Status: AC | PRN

## 2024-11-02 NOTE — Addendum Note (Signed)
 Addended by: NANCEE JON SAILOR on: 11/02/2024 05:23 PM   Modules accepted: Orders

## 2024-11-02 NOTE — Progress Notes (Signed)
  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  Please see that note for additional details.She reports that she was told by her primary care physician that she is highly allergic to pork and egg white. After eating pork she will be in the bathroom within 30 minutes.  Today reports for allergy diagnostic testing:    DIAGNOSTICS:  Skin Testing: Select foods. Adequate positive and negative controls Results discussed with patient/family.  Food Adult Perc - 11/02/24 1400     Time Antigen Placed 1425    Allergen Manufacturer Jestine    Location Back    Number of allergen test 8     Control-buffer 50% Glycerol Negative    Control-Histamine 3+   5x5   1. Peanut Negative    3. Wheat --   5x5   5. Milk, Cow --   5x5   7. Egg White, Chicken Negative    10. Cashew --   4x4   15. Pistachio --   5x5   35. Pork Negative    64. Watermelon Negative            Allergy testing results were read and interpreted by myself, documented by clinical staff.  Patient provided with copy of allergy testing along with avoidance measures when indicated.   1. Chronic diarrhea and nausea -She reports within 30 minutes of eating pork she will be in the bathroom - We placed another referral to see Dr. Ruel Kung at last office visit - Skin testing today is positive to pistachio, cashew, cows milk, and wheat with adequate controls. - Copy of skin testing given -Start avoiding wheat, cows milk (dairy products), tree nuts, egg and pork. In case of an allergic reaction, give Benadryl  50 mg every 6 hours, and if life-threatening symptoms occur, inject with EpiPen 0.3 mg.  Discussed that she will avoid egg and pork due to elevated lab testing done at a another office and symptoms  that occur after eating -Demonstration given on how to use EpiPen -Emergency action plan given and reviewed - Waiting on results for mast cell activation. Will call you with results once they are all back.  2. Follow up in 2 weeks with Dr. Iva or sooner if needed    Please inform us  of any Emergency Department visits, hospitalizations, or changes in symptoms. Call us  before going to the ED for breathing or allergy symptoms since we might be able to fit you in for a sick visit. Feel free to contact us  anytime with any questions, problems, or concerns.  It was a pleasure to meet you today!  Websites that have reliable patient information: 1. American Academy of Asthma, Allergy, and Immunology: www.aaaai.org 2. Food Allergy Research and Education (FARE): foodallergy.org 3. Mothers of Asthmatics: http://www.asthmacommunitynetwork.org 4. American College of Allergy, Asthma, and Immunology: www.acaai.org   Wanda Craze, FNP [facility]

## 2024-11-02 NOTE — Addendum Note (Signed)
 Addended by: NANCEE JON SAILOR on: 11/02/2024 05:14 PM   Modules accepted: Orders

## 2024-11-07 ENCOUNTER — Ambulatory Visit: Payer: Self-pay | Admitting: Allergy & Immunology

## 2024-11-07 DIAGNOSIS — N3941 Urge incontinence: Secondary | ICD-10-CM | POA: Diagnosis not present

## 2024-11-07 DIAGNOSIS — K580 Irritable bowel syndrome with diarrhea: Secondary | ICD-10-CM | POA: Diagnosis not present

## 2024-11-14 DIAGNOSIS — R569 Unspecified convulsions: Secondary | ICD-10-CM | POA: Diagnosis not present

## 2024-11-14 DIAGNOSIS — G479 Sleep disorder, unspecified: Secondary | ICD-10-CM | POA: Diagnosis not present

## 2024-11-14 DIAGNOSIS — F331 Major depressive disorder, recurrent, moderate: Secondary | ICD-10-CM | POA: Diagnosis not present

## 2024-11-14 DIAGNOSIS — F411 Generalized anxiety disorder: Secondary | ICD-10-CM | POA: Diagnosis not present

## 2024-11-14 DIAGNOSIS — G40802 Other epilepsy, not intractable, without status epilepticus: Secondary | ICD-10-CM | POA: Diagnosis not present

## 2024-11-14 LAB — LEUKOTRIENE E4, 24 HR, U
Collection Duration: 24 h
Creatinine Concentration,24 HR: 98 mg/dL
Creatinine, 24 HR, U: 833 mg/(24.h) (ref 603–1783)
Leukotriene E4, U: 323 pg/mg{creat} — ABNORMAL HIGH (ref <=104)
Urine Volume: 850 mL

## 2024-11-14 LAB — PROSTAGLANDIN D2/CREATININE, U
Creatinine, Urine: 50 mg/dL
Prostaglandin D2, urine: 5.1 pg/mL
Prostaglandin D2/Cr Ratio: 10 ng/g

## 2024-11-14 LAB — 2,3-DINOR 11BPG F2, 24HR URINE
2,3-dinor 11B-Prostaglandin: 1427 pg/mg{creat} (ref <1802)
Creatinine Concentration,24 HR: 96 mg/dL
Creatinine, 24 HR, U: 816 mg/(24.h) (ref 603–1783)

## 2024-11-14 LAB — N-METHYLHISTAMINE, 24 HR, U
Collection Duration (h): 24 h
Creatinine Concent. 24 Hr, U: 96 mg/dL
Creatinine, 24 Hour, U: 816 mg/(24.h) (ref 603–1783)
N-Methylhistamine, 24 Hr, U: 159 ug/g{creat} (ref 30–200)
Urine Volume (mL): 850 mL

## 2024-11-17 ENCOUNTER — Ambulatory Visit: Admitting: Allergy & Immunology

## 2024-11-17 ENCOUNTER — Ambulatory Visit (INDEPENDENT_AMBULATORY_CARE_PROVIDER_SITE_OTHER): Admitting: Family

## 2024-11-17 ENCOUNTER — Encounter: Payer: Self-pay | Admitting: Family

## 2024-11-17 VITALS — BP 118/72 | HR 87 | Temp 98.0°F | Ht 63.0 in | Wt 140.8 lb

## 2024-11-17 DIAGNOSIS — J329 Chronic sinusitis, unspecified: Secondary | ICD-10-CM

## 2024-11-17 MED ORDER — AMOXICILLIN 875 MG PO TABS: 875.0000 mg | ORAL_TABLET | Freq: Two times a day (BID) | ORAL | 0 refills | Status: AC
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
## 2024-11-18 ENCOUNTER — Encounter: Payer: Self-pay | Admitting: *Deleted

## 2024-11-30 DIAGNOSIS — E890 Postprocedural hypothyroidism: Secondary | ICD-10-CM | POA: Diagnosis not present

## 2024-12-06 DIAGNOSIS — E890 Postprocedural hypothyroidism: Secondary | ICD-10-CM | POA: Diagnosis not present

## 2024-12-06 DIAGNOSIS — F331 Major depressive disorder, recurrent, moderate: Secondary | ICD-10-CM | POA: Diagnosis not present

## 2024-12-06 DIAGNOSIS — F411 Generalized anxiety disorder: Secondary | ICD-10-CM | POA: Diagnosis not present

## 2024-12-06 DIAGNOSIS — G40802 Other epilepsy, not intractable, without status epilepticus: Secondary | ICD-10-CM | POA: Diagnosis not present

## 2024-12-06 DIAGNOSIS — G479 Sleep disorder, unspecified: Secondary | ICD-10-CM | POA: Diagnosis not present

## 2024-12-07 DIAGNOSIS — M3505 Sjogren syndrome with inflammatory arthritis: Secondary | ICD-10-CM | POA: Diagnosis not present

## 2024-12-07 DIAGNOSIS — K580 Irritable bowel syndrome with diarrhea: Secondary | ICD-10-CM | POA: Diagnosis not present

## 2024-12-07 DIAGNOSIS — M25562 Pain in left knee: Secondary | ICD-10-CM | POA: Diagnosis not present

## 2024-12-07 DIAGNOSIS — G8929 Other chronic pain: Secondary | ICD-10-CM | POA: Diagnosis not present

## 2024-12-07 DIAGNOSIS — M350C Sjogren syndrome with dental involvement: Secondary | ICD-10-CM | POA: Diagnosis not present

## 2024-12-07 DIAGNOSIS — M2170 Unequal limb length (acquired), unspecified site: Secondary | ICD-10-CM | POA: Diagnosis not present

## 2024-12-07 DIAGNOSIS — Z87820 Personal history of traumatic brain injury: Secondary | ICD-10-CM | POA: Diagnosis not present

## 2024-12-07 DIAGNOSIS — N3941 Urge incontinence: Secondary | ICD-10-CM | POA: Diagnosis not present

## 2024-12-08 DIAGNOSIS — H53461 Homonymous bilateral field defects, right side: Secondary | ICD-10-CM | POA: Diagnosis not present

## 2024-12-12 ENCOUNTER — Ambulatory Visit: Payer: Self-pay | Admitting: Urology

## 2024-12-12 VITALS — BP 113/77 | HR 61 | Ht 63.0 in | Wt 144.8 lb

## 2024-12-12 DIAGNOSIS — N3946 Mixed incontinence: Secondary | ICD-10-CM | POA: Diagnosis not present
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
## 2024-12-12 NOTE — Patient Instructions (Signed)

## 2024-12-13 LAB — URINALYSIS, COMPLETE
Bilirubin, UA: NEGATIVE
Glucose, UA: NEGATIVE
Leukocytes,UA: NEGATIVE
Nitrite, UA: NEGATIVE
Protein,UA: NEGATIVE
RBC, UA: NEGATIVE
Specific Gravity, UA: 1.025 (ref 1.005–1.030)
Urobilinogen, Ur: 0.2 mg/dL (ref 0.2–1.0)
pH, UA: 6 (ref 5.0–7.5)

## 2024-12-13 LAB — MICROSCOPIC EXAMINATION

## 2024-12-15 LAB — CULTURE, URINE COMPREHENSIVE

## 2025-01-16 ENCOUNTER — Ambulatory Visit: Payer: Self-pay

## 2025-01-16 NOTE — Telephone Encounter (Signed)
 2nd attempt. Called phone number listed on patient's chart twice. No answer. Left voicemail for patient to return call for nurse triage.

## 2025-01-16 NOTE — Telephone Encounter (Signed)
 noted

## 2025-01-16 NOTE — Telephone Encounter (Signed)
 FYI Only or Action Required?: FYI only for provider: home care advised, seen at urgent care yesterday.  Patient was last seen in primary care on 11/17/2024 by Corwin Antu, FNP.  Called Nurse Triage reporting Otalgia and Cough.  Symptoms began several days ago.  Interventions attempted: Other: mother sending family member to pick up patient's amoxicillin  to start today.  Symptoms are: unchanged.  Triage Disposition: Home Care  Patient/caregiver understands and will follow disposition?: Yes                 Message from [facility] F sent at 01/16/2025  8:33 AM EST      Summary: Extreme fatigue/weakness    Reason for Triage: Patient was seen at urgent care yesterday and dx with ear infection, negative for flu and covid.  She presents with the following complaints: Cough, sore throat, stuffy nose, ear ache, extreme fatigue/weakness, chills          Reason for Disposition  [1] Taking antibiotic < 72 hours (3 days) and [2] ear pain persists  Answer Assessment - Initial Assessment Questions Seen at [facility] yesterday, diagnosed with ear infection. Has not started antibiotic yet.  1. ANTIBIOTIC: What antibiotic are you taking? How many times per day?     Amoxicillin  prescribed by urgent care.  2. ONSET: When was the antibiotic started?     Has not picked up or started yet.  3. LOCATION: Which ear is involved?     Left ear.  4. PAIN: How bad is the pain?   (Scale 0-10; none, mild, moderate or severe)     Varies; last night was sever/agonizing. This AM no pain.  5. FEVER: Do you have a fever? If Yes, ask: What is your temperature, how was it measured, and when did it start?     Unsure, doesn't have thermometer to check.  6. DISCHARGE: Is there any discharge? If Yes, ask: What color is it? (e.g., clear, white; yellow, green; bloody)     No.  7. OTHER SYMPTOMS: Do you have any other symptoms? (e.g., headache, stiff neck, dizziness, vomiting,  runny nose)     Decreased hearing and congestion in left ear, cough, nasal congestion, sore throat, stomach ache, fatigue/tired. No vomiting.  8. PREGNANCY: Is there any chance you are pregnant? When was your last menstrual period?     Hysterectomy.  Protocols used: Ear - Otitis Media Follow-up Call-A-AH

## 2025-01-16 NOTE — Telephone Encounter (Signed)
 1st attempt, called phone number listed for patient twice. No answer, voicemail states it is the phone of Holly Middleton. Left voicemail for patient to return call to nurse triage.    Message from [place] F sent at 01/16/2025  8:33 AM EST  Summary: Extreme fatigue/weakness   Reason for Triage: Patient was seen at urgent care yesterday and dx with ear infection, negative for flu and covid.  She presents with the following complaints: Cough, sore throat, stuffy nose, ear ache, extreme fatigue/weakness, chills

## 2025-01-18 NOTE — Telephone Encounter (Signed)
 NOTED

## 2025-02-20 ENCOUNTER — Other Ambulatory Visit: Admitting: Urology
